# Patient Record
Sex: Male | Born: 1990 | Race: Black or African American | Hispanic: No | Marital: Single | Smoking: Never smoker
Health system: Southern US, Community
[De-identification: ages and names within clinical notes are randomized; demographics above are authoritative.]

## PROBLEM LIST (undated history)

## (undated) HISTORY — PX: WISDOM TOOTH EXTRACTION: SHX21

---
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
## 2010-02-19 ENCOUNTER — Emergency Department (HOSPITAL_COMMUNITY): Admission: EM | Admit: 2010-02-19 | Discharge: 2010-02-19 | Payer: Self-pay | Admitting: Family Medicine

## 2010-03-15 ENCOUNTER — Emergency Department (HOSPITAL_COMMUNITY): Admission: EM | Admit: 2010-03-15 | Discharge: 2010-03-15 | Payer: Self-pay | Admitting: Emergency Medicine

## 2010-06-24 ENCOUNTER — Ambulatory Visit (HOSPITAL_COMMUNITY): Admission: RE | Admit: 2010-06-24 | Discharge: 2010-06-24 | Payer: Self-pay | Admitting: General Surgery

## 2010-07-21 ENCOUNTER — Emergency Department (HOSPITAL_COMMUNITY): Admission: EM | Admit: 2010-07-21 | Discharge: 2010-07-22 | Payer: Self-pay | Admitting: Emergency Medicine

## 2010-11-22 ENCOUNTER — Inpatient Hospital Stay (INDEPENDENT_AMBULATORY_CARE_PROVIDER_SITE_OTHER)
Admission: RE | Admit: 2010-11-22 | Discharge: 2010-11-22 | Disposition: A | Discharge status: Home / Self Care | Payer: Self-pay | Source: Ambulatory Visit | Attending: Family Medicine | Admitting: Family Medicine

## 2010-11-22 DIAGNOSIS — J019 Acute sinusitis, unspecified: Secondary | ICD-10-CM

## 2010-12-02 LAB — SURGICAL PCR SCREEN: Staphylococcus aureus: NEGATIVE

## 2010-12-02 LAB — CBC
HCT: 40.2 % (ref 39.0–52.0)
Hemoglobin: 13.3 g/dL (ref 13.0–17.0)
MCH: 28.7 pg (ref 26.0–34.0)
RBC: 4.64 MIL/uL (ref 4.22–5.81)

## 2010-12-02 LAB — POCT I-STAT 4, (NA,K, GLUC, HGB,HCT)
Glucose, Bld: 85 mg/dL (ref 70–99)
Potassium: 3.9 mEq/L (ref 3.5–5.1)

## 2010-12-06 LAB — POCT I-STAT, CHEM 8
HCT: 47 % (ref 39.0–52.0)
Hemoglobin: 16 g/dL (ref 13.0–17.0)
Potassium: 3.8 mEq/L (ref 3.5–5.1)
Sodium: 141 mEq/L (ref 135–145)

## 2010-12-06 LAB — DIFFERENTIAL
Basophils Relative: 1 % (ref 0–1)
Eosinophils Absolute: 0 10*3/uL (ref 0.0–0.7)
Lymphs Abs: 1.6 10*3/uL (ref 0.7–4.0)
Neutrophils Relative %: 69 % (ref 43–77)

## 2010-12-06 LAB — CBC
MCHC: 33.5 g/dL (ref 30.0–36.0)
MCV: 89.9 fL (ref 78.0–100.0)
Platelets: 279 10*3/uL (ref 150–400)
WBC: 7.1 10*3/uL (ref 4.0–10.5)

## 2011-01-13 ENCOUNTER — Emergency Department (HOSPITAL_COMMUNITY)
Admission: EM | Admit: 2011-01-13 | Discharge: 2011-01-14 | Disposition: A | Discharge status: Home / Self Care | Payer: Self-pay | Attending: Emergency Medicine | Admitting: Emergency Medicine

## 2011-01-13 DIAGNOSIS — R55 Syncope and collapse: Secondary | ICD-10-CM | POA: Insufficient documentation

## 2011-01-14 LAB — BASIC METABOLIC PANEL
GFR calc Af Amer: >60 mL/min (ref >60)
GFR calc non Af Amer: >60 mL/min (ref >60)
Potassium: 4.2 mEq/L (ref 3.5–5.1)
Sodium: 140 mEq/L (ref 135–145)

## 2011-01-14 LAB — URINALYSIS, ROUTINE W REFLEX MICROSCOPIC
Bilirubin Urine: NEGATIVE
Hgb urine dipstick: NEGATIVE
Nitrite: NEGATIVE
Specific Gravity, Urine: 1.021 (ref 1.005–1.030)
pH: 6 (ref 5.0–8.0)

## 2011-01-14 LAB — DIFFERENTIAL
Lymphocytes Relative: 34 % (ref 12–46)
Lymphs Abs: 2.6 10*3/uL (ref 0.7–4.0)
Neutrophils Relative %: 57 % (ref 43–77)

## 2011-01-14 LAB — CBC
HCT: 40.9 % (ref 39.0–52.0)
MCV: 87.2 fL (ref 78.0–100.0)
Platelets: 295 10*3/uL (ref 150–400)
RBC: 4.69 MIL/uL (ref 4.22–5.81)
WBC: 7.8 10*3/uL (ref 4.0–10.5)

## 2011-06-20 LAB — GC/CHLAMYDIA PROBE AMP, GENITAL
Chlamydia, DNA Probe: NEGATIVE
GC Probe Amp, Genital: NEGATIVE

## 2012-01-24 ENCOUNTER — Encounter (HOSPITAL_COMMUNITY): Payer: Self-pay | Admitting: *Deleted

## 2012-01-24 ENCOUNTER — Emergency Department (HOSPITAL_COMMUNITY)
Admission: EM | Admit: 2012-01-24 | Discharge: 2012-01-25 | Disposition: A | Discharge status: Home / Self Care | Payer: Self-pay | Attending: Emergency Medicine | Admitting: Emergency Medicine

## 2012-01-24 DIAGNOSIS — R509 Fever, unspecified: Secondary | ICD-10-CM | POA: Insufficient documentation

## 2012-01-24 DIAGNOSIS — IMO0001 Reserved for inherently not codable concepts without codable children: Secondary | ICD-10-CM | POA: Insufficient documentation

## 2012-01-24 DIAGNOSIS — R599 Enlarged lymph nodes, unspecified: Secondary | ICD-10-CM | POA: Insufficient documentation

## 2012-01-24 DIAGNOSIS — R51 Headache: Secondary | ICD-10-CM | POA: Insufficient documentation

## 2012-01-24 DIAGNOSIS — J020 Streptococcal pharyngitis: Secondary | ICD-10-CM | POA: Insufficient documentation

## 2012-01-24 LAB — COMPREHENSIVE METABOLIC PANEL
AST: 23 U/L (ref 0–37)
Albumin: 4.5 g/dL (ref 3.5–5.2)
Alkaline Phosphatase: 74 U/L (ref 39–117)
Chloride: 102 mEq/L (ref 96–112)
Potassium: 3.7 mEq/L (ref 3.5–5.1)
Sodium: 139 mEq/L (ref 135–145)
Total Bilirubin: 0.5 mg/dL (ref 0.3–1.2)
Total Protein: 7.5 g/dL (ref 6.0–8.3)

## 2012-01-24 LAB — CBC
Hemoglobin: 14.4 g/dL (ref 13.0–17.0)
MCH: 29.8 pg (ref 26.0–34.0)
MCHC: 33.8 g/dL (ref 30.0–36.0)
Platelets: 281 10*3/uL (ref 150–400)
RDW: 12.1 % (ref 11.5–15.5)

## 2012-01-24 LAB — URINALYSIS, ROUTINE W REFLEX MICROSCOPIC
Ketones, ur: NEGATIVE mg/dL
Leukocytes, UA: NEGATIVE
Nitrite: NEGATIVE
Protein, ur: NEGATIVE mg/dL
Urobilinogen, UA: 1 mg/dL (ref 0.0–1.0)
pH: 7.5 (ref 5.0–8.0)

## 2012-01-24 LAB — DIFFERENTIAL
Basophils Absolute: 0 10*3/uL (ref 0.0–0.1)
Basophils Relative: 0 % (ref 0–1)
Eosinophils Absolute: 0 10*3/uL (ref 0.0–0.7)
Neutro Abs: 8.3 10*3/uL — ABNORMAL HIGH (ref 1.7–7.7)
Neutrophils Relative %: 73 % (ref 43–77)

## 2012-01-24 NOTE — ED Notes (Signed)
Aching all over chills nausea no diarrhea

## 2012-01-25 LAB — RAPID STREP SCREEN (MED CTR MEBANE ONLY): Streptococcus, Group A Screen (Direct): POSITIVE — AB

## 2012-01-25 MED ORDER — ONDANSETRON HCL 4 MG/2ML IJ SOLN
4.0000 mg | Freq: Once | INTRAMUSCULAR | Status: AC
  Administered 2012-01-25: 4 mg via INTRAVENOUS
  Filled 2012-01-25: qty 2

## 2012-01-25 MED ORDER — KETOROLAC TROMETHAMINE 30 MG/ML IJ SOLN
30.0000 mg | Freq: Once | INTRAMUSCULAR | Status: AC
  Administered 2012-01-25: 30 mg via INTRAVENOUS
  Filled 2012-01-25: qty 1

## 2012-01-25 MED ORDER — SODIUM CHLORIDE 0.9 % IV BOLUS (SEPSIS)
1000.0000 mL | Freq: Once | INTRAVENOUS | Status: AC
  Administered 2012-01-25: 1000 mL via INTRAVENOUS

## 2012-01-25 MED ORDER — PENICILLIN G BENZATHINE 1200000 UNIT/2ML IM SUSP
1.2000 10*6.[IU] | INTRAMUSCULAR | Status: AC
  Administered 2012-01-25: 1.2 10*6.[IU] via INTRAMUSCULAR
  Filled 2012-01-25: qty 2

## 2012-01-25 NOTE — Discharge Instructions (Signed)
Salt Water Gargle  This solution will help make your mouth and throat feel better.  HOME CARE INSTRUCTIONS     Mix 1 teaspoon of salt in 8 ounces of warm water.   Gargle with this solution as much or often as you need or as directed. Swish and gargle gently if you have any sores or wounds in your mouth.   Do not swallow this mixture.  Document Released: 06/09/2004 Document Revised: 08/25/2011 Document Reviewed: 10/31/2008  Exit[employer] Patient Information 2012 [employer]    Strep Throat  Strep throat is an infection of the throat caused by a bacteria named Streptococcus pyogenes. Your caregiver may call the infection streptococcal "tonsillitis" or "pharyngitis" depending on whether there are signs of inflammation in the tonsils or back of the throat. Strep throat is most common in children from 5 to 15 years old during the cold months of the year, but it can occur in people of any age during any season. This infection is spread from person to person (contagious) through coughing, sneezing, or other close contact.  SYMPTOMS     Fever or chills.   Painful, swollen, red tonsils or throat.   Pain or difficulty when swallowing.   White or yellow spots on the tonsils or throat.   Swollen, tender lymph nodes or "glands" of the neck or under the jaw.   Red rash all over the body (rare).  DIAGNOSIS    Many different infections can cause the same symptoms. A test must be done to confirm the diagnosis so the right treatment can be given. A "rapid strep test" can help your caregiver make the diagnosis in a few minutes. If this test is not available, a light swab of the infected area can be used for a throat culture test. If a throat culture test is done, results are usually available in a day or two.  TREATMENT    Strep throat is treated with antibiotic medicine.  HOME CARE INSTRUCTIONS     Gargle with 1 tsp of salt in 1 cup of warm water, 3 to 4 times per day or as needed for comfort.    Family members who also have a sore throat or fever should be tested for strep throat and treated with antibiotics if they have the strep infection.   Make sure everyone in your household washes their hands well.   Do not share food, drinking cups, or personal items that could cause the infection to spread to others.   You may need to eat a soft food diet until your sore throat gets better.   Drink enough water and fluids to keep your urine clear or pale yellow. This will help prevent dehydration.   Get plenty of rest.   Stay home from school, daycare, or work until you have been on antibiotics for 24 hours.   Only take over-the-counter or prescription medicines for pain, discomfort, or fever as directed by your caregiver.   If antibiotics are prescribed, take them as directed. Finish them even if you start to feel better.  SEEK MEDICAL CARE IF:     The glands in your neck continue to enlarge.   You develop a rash, cough, or earache.   You cough up green, yellow-brown, or bloody sputum.   You have pain or discomfort not controlled by medicines.   Your problems seem to be getting worse rather than better.  SEEK IMMEDIATE MEDICAL CARE IF:     You develop any new symptoms such   as vomiting, severe headache, stiff or painful neck, chest pain, shortness of breath, or trouble swallowing.   You develop severe throat pain, drooling, or changes in your voice.   You develop swelling of the neck, or the skin on the neck becomes red and tender.   You have a fever.   You develop signs of dehydration, such as fatigue, dry mouth, and decreased urination.   You become increasingly sleepy, or you cannot wake up completely.  Document Released: 09/02/2000 Document Revised: 08/25/2011 Document Reviewed: 11/04/2010  [employer] Patient Information 2012 [employer].

## 2012-01-25 NOTE — ED Provider Notes (Signed)
History     CSN: [id number]  Arrival date & time 01/24/12  2151   First MD Initiated Contact with Patient 01/25/12 0105      Chief Complaint  Patient presents with  . aching all over     (Consider location/radiation/quality/duration/timing/severity/associated sxs/prior treatment) HPI Comments: Patient here with sore throat since today - states that he felt fine when he awoke - states now with body aches, headache, fever, chills, - states he is able to swallow but pain with swallowing - reports nausea but denies vomiting, diarrhea, abdominal pain.  Patient is a 21 y.o. male presenting with pharyngitis. The history is provided by the patient. No language interpreter was used.  Sore Throat This is a new problem. The current episode started today. The problem occurs constantly. The problem has been unchanged. Associated symptoms include anorexia, chills, congestion, a fever, headaches, myalgias, a sore throat and swollen glands. Pertinent negatives include no abdominal pain, arthralgias, change in bowel habit, chest pain, coughing, diaphoresis, fatigue, joint swelling, nausea, neck pain, numbness, rash, urinary symptoms, vertigo, visual change, vomiting or weakness. The symptoms are aggravated by nothing. He has tried nothing for the symptoms. The treatment provided no relief.    History reviewed. No pertinent past medical history.  History reviewed. No pertinent past surgical history.  No family history on file.  History  Substance Use Topics  . Smoking status: Never Smoker   . Smokeless tobacco: Not on file  . Alcohol Use: Yes      Review of Systems  Constitutional: Positive for fever and chills. Negative for diaphoresis and fatigue.  HENT: Positive for congestion and sore throat. Negative for neck pain.   Respiratory: Negative for cough.   Cardiovascular: Negative for chest pain.  Gastrointestinal: Positive for anorexia. Negative for nausea, vomiting, abdominal pain and change  in bowel habit.  Musculoskeletal: Positive for myalgias. Negative for joint swelling and arthralgias.  Skin: Negative for rash.  Neurological: Positive for headaches. Negative for vertigo, weakness and numbness.  All other systems reviewed and are negative.    Allergies  Review of patient's allergies indicates no known allergies.  Home Medications  No current outpatient prescriptions on file.  BP 103/55  Pulse 103  Temp(Src) 99.9 F (37.7 C) (Oral)  Resp 16  SpO2 100%  Physical Exam  Nursing note and vitals reviewed. Constitutional: He is oriented to person, place, and time. He appears well-developed and well-nourished. No distress.  HENT:  Head: Normocephalic and atraumatic.  Right Ear: External ear normal.  Left Ear: External ear normal.  Nose: Nose normal.  Mouth/Throat: Oropharyngeal exudate present.       Posterior pharyngeal erythema  Eyes: Conjunctivae are normal. Pupils are equal, round, and reactive to light. No scleral icterus.  Neck: Normal range of motion. Neck supple.       Anterior cervical lymphadenopathy  Cardiovascular: Regular rhythm and normal heart sounds.  Exam reveals no gallop and no friction rub.   No murmur heard.      tachycardia  Pulmonary/Chest: Effort normal and breath sounds normal. No respiratory distress. He has no wheezes. He has no rales. He exhibits no tenderness.  Abdominal: Soft. Bowel sounds are normal. He exhibits no distension. There is no tenderness.  Musculoskeletal: Normal range of motion. He exhibits no edema and no tenderness.  Lymphadenopathy:    He has cervical adenopathy.  Neurological: He is alert and oriented to person, place, and time. No cranial nerve deficit.  Skin: Skin is warm and dry. No  rash noted. No erythema. No pallor.  Psychiatric: He has a normal mood and affect. His behavior is normal. Judgment and thought content normal.    ED Course  Procedures (including critical care time)  Labs Reviewed  CBC -  Abnormal; Notable for the following:    WBC 11.3 (*)    All other components within normal limits  DIFFERENTIAL - Abnormal; Notable for the following:    Neutro Abs 8.3 (*)    All other components within normal limits  RAPID STREP SCREEN - Abnormal; Notable for the following:    Streptococcus, Group A Screen (Direct) POSITIVE (*)    All other components within normal limits  URINALYSIS, ROUTINE W REFLEX MICROSCOPIC  COMPREHENSIVE METABOLIC PANEL   No results found.  Results for orders placed during the hospital encounter of 01/24/12  URINALYSIS, ROUTINE W REFLEX MICROSCOPIC      Component Value Range   Color, Urine YELLOW  YELLOW    APPearance CLEAR  CLEAR    Specific Gravity, Urine 1.020  1.005 - 1.030    pH 7.5  5.0 - 8.0    Glucose, UA NEGATIVE  NEGATIVE (mg/dL)   Hgb urine dipstick NEGATIVE  NEGATIVE    Bilirubin Urine NEGATIVE  NEGATIVE    Ketones, ur NEGATIVE  NEGATIVE (mg/dL)   Protein, ur NEGATIVE  NEGATIVE (mg/dL)   Urobilinogen, UA 1.0  0.0 - 1.0 (mg/dL)   Nitrite NEGATIVE  NEGATIVE    Leukocytes, UA NEGATIVE  NEGATIVE   CBC      Component Value Range   WBC 11.3 (*) 4.0 - 10.5 (K/uL)   RBC 4.84  4.22 - 5.81 (MIL/uL)   Hemoglobin 14.4  13.0 - 17.0 (g/dL)   HCT 65.7  84.6 - 96.2 (%)   MCV 88.0  78.0 - 100.0 (fL)   MCH 29.8  26.0 - 34.0 (pg)   MCHC 33.8  30.0 - 36.0 (g/dL)   RDW 95.2  84.1 - 32.4 (%)   Platelets 281  150 - 400 (K/uL)  DIFFERENTIAL      Component Value Range   Neutrophils Relative 73  43 - 77 (%)   Neutro Abs 8.3 (*) 1.7 - 7.7 (K/uL)   Lymphocytes Relative 18  12 - 46 (%)   Lymphs Abs 2.0  0.7 - 4.0 (K/uL)   Monocytes Relative 8  3 - 12 (%)   Monocytes Absolute 0.9  0.1 - 1.0 (K/uL)   Eosinophils Relative 0  0 - 5 (%)   Eosinophils Absolute 0.0  0.0 - 0.7 (K/uL)   Basophils Relative 0  0 - 1 (%)   Basophils Absolute 0.0  0.0 - 0.1 (K/uL)  COMPREHENSIVE METABOLIC PANEL      Component Value Range   Sodium 139  135 - 145 (mEq/L)   Potassium  3.7  3.5 - 5.1 (mEq/L)   Chloride 102  96 - 112 (mEq/L)   CO2 29  19 - 32 (mEq/L)   Glucose, Bld 95  70 - 99 (mg/dL)   BUN 10  6 - 23 (mg/dL)   Creatinine, Ser 4.01  0.50 - 1.35 (mg/dL)   Calcium 02.7  8.4 - 10.5 (mg/dL)   Total Protein 7.5  6.0 - 8.3 (g/dL)   Albumin 4.5  3.5 - 5.2 (g/dL)   AST 23  0 - 37 (U/L)   ALT 38  0 - 53 (U/L)   Alkaline Phosphatase 74  39 - 117 (U/L)   Total Bilirubin 0.5  0.3 - 1.2 (  mg/dL)   GFR calc non Af Amer >90  >90 (mL/min)   GFR calc Af Amer >90  >90 (mL/min)  RAPID STREP SCREEN      Component Value Range   Streptococcus, Group A Screen (Direct) POSITIVE (*) NEGATIVE    No results found.   Strep pharyngitis   MDM  Patient with strep pharyngitis - feels better after a liter of fluids and toradol for pain and body aches - given injection of bicillin LA for the infection.        Izola Price [place], [place] 01/25/12 0301

## 2012-01-25 NOTE — ED Provider Notes (Signed)
Medical screening examination/treatment/procedure(s) were performed by non-physician practitioner and as supervising physician I was immediately available for consultation/collaboration.   Wandalee Klang, MD 01/25/12 0702 

## 2014-02-21 ENCOUNTER — Emergency Department (INDEPENDENT_AMBULATORY_CARE_PROVIDER_SITE_OTHER)
Admission: EM | Admit: 2014-02-21 | Discharge: 2014-02-21 | Disposition: A | Discharge status: Home / Self Care | Payer: Self-pay | Source: Home / Self Care | Attending: Emergency Medicine | Admitting: Emergency Medicine

## 2014-02-21 ENCOUNTER — Encounter (HOSPITAL_COMMUNITY): Payer: Self-pay | Admitting: Emergency Medicine

## 2014-02-21 DIAGNOSIS — J019 Acute sinusitis, unspecified: Secondary | ICD-10-CM

## 2014-02-21 MED ORDER — IBUPROFEN 800 MG PO TABS
800.0000 mg | ORAL_TABLET | Freq: Once | ORAL | Status: AC
  Administered 2014-02-21: 800 mg via ORAL

## 2014-02-21 MED ORDER — IBUPROFEN 800 MG PO TABS
ORAL_TABLET | ORAL | Status: AC
  Filled 2014-02-21: qty 1

## 2014-02-21 MED ORDER — HYDROCODONE-ACETAMINOPHEN 5-325 MG PO TABS
ORAL_TABLET | ORAL | Status: DC
Start: 2014-02-21 — End: 2014-04-22

## 2014-02-21 MED ORDER — AMOXICILLIN 500 MG PO CAPS: 1000.0000 mg | ORAL_CAPSULE | Freq: Three times a day (TID) | ORAL | Status: DC

## 2014-02-21 MED ORDER — GUAIFENESIN-CODEINE 100-10 MG/5ML PO SYRP: 10.0000 mL | ORAL_SOLUTION | Freq: Four times a day (QID) | ORAL | Status: DC | PRN

## 2014-02-21 NOTE — ED Provider Notes (Signed)
  Chief Complaint   Chief Complaint  Patient presents with  . URI    History of Present Illness   Jeffrey Peck is a 23 year old male who has had a two-day history of chills, myalgias, headache, nasal congestion with yellow-green drainage, sinus pressure, red eyes, dry cough, back pain, and sore throat. He denies any wheezing, difficulty breathing, or GI symptoms. No sick exposures.  Review of Systems   Other than as noted above, the patient denies any of the following symptoms: Systemic:  No fevers, chills, sweats, or myalgias. Eye:  No redness or discharge. ENT:  No ear pain, headache, nasal congestion, drainage, sinus pressure, or sore throat. Neck:  No neck pain, stiffness, or swollen glands. Lungs:  No cough, sputum production, hemoptysis, wheezing, chest tightness, shortness of breath or chest pain. GI:  No abdominal pain, nausea, vomiting or diarrhea.  PMFSH   Past medical history, family history, social history, meds, and allergies were reviewed.   Physical exam   Vital signs:  BP 129/83  Pulse 73  Temp(Src) 98.2 F (36.8 C) (Oral)  Resp 18  SpO2 100% General:  Alert and oriented.  In no distress.  Skin warm and dry. Eye:  No conjunctival injection or drainage. Lids were normal. ENT:  TMs and canals were normal, without erythema or inflammation.  Nasal mucosa was congested with yellow drainage in the left nostril.  Mucous membranes were moist.  Pharynx was clear with no exudate or drainage.  There were no oral ulcerations or lesions. Neck:  Supple, no adenopathy, tenderness or mass. Lungs:  No respiratory distress.  Lungs were clear to auscultation, without wheezes, rales or rhonchi.  Breath sounds were clear and equal bilaterally.  Heart:  Regular rhythm, without gallops, murmers or rubs. Skin:  Clear, warm, and dry, without rash or lesions.   Assessment     The encounter diagnosis was Acute sinusitis.  Plan    1.  Meds:  The following meds were prescribed:    Discharge Medication List as of 02/21/2014  9:04 PM    START taking these medications   Details  amoxicillin (AMOXIL) 500 MG capsule Take 2 capsules (1,000 mg total) by mouth 3 (three) times daily., Starting 02/21/2014, Until Discontinued, Normal    guaiFENesin-codeine (GUIATUSS AC) 100-10 MG/5ML syrup Take 10 mLs by mouth 4 (four) times daily as needed for cough., Starting 02/21/2014, Until Discontinued, Print    HYDROcodone-acetaminophen (NORCO/VICODIN) 5-325 MG per tablet 1 to 2 tabs every 4 to 6 hours as needed for pain., Print        2.  Patient Education/Counseling:  The patient was given appropriate handouts, self care instructions, and instructed in symptomatic relief.  Instructed to get extra fluids, rest, and use a cool mist vaporizer.    3.  Follow up:  The patient was told to follow up here if no better in 3 to 4 days, or sooner if becoming worse in any way, and given some red flag symptoms such as increasing fever, difficulty breathing, chest pain, or persistent vomiting which would prompt immediate return.  Follow up here as needed.      Reuben Likes, MD 02/21/14 [phone]

## 2014-02-21 NOTE — ED Notes (Signed)
Pt c/o cold sx onset yest  Sx include: chlls, BA, HA, congestion Denies f/v/n/d Alert w/no signs of acute distress.

## 2014-02-21 NOTE — Discharge Instructions (Signed)
Most upper respiratory infections are caused by viruses and do not require antibiotics.  We try to save the antibiotics for when we really need them to prevent bacteria from developing resistance to them.  Here are a few hints about things that can be done at home to help get over an upper respiratory infection quicker: ° °Get extra sleep and extra fluids.  Get 7 to 9 hours of sleep per night and 6 to 8 glasses of water a day.  Getting extra sleep keeps the immune system from getting run down.  Most people with an upper respiratory infection are a little dehydrated.  The extra fluids also keep the secretions liquified and easier to deal with.  Also, get extra vitamin C.  4000 mg per day is the recommended dose. °For the aches, headache, and fever, acetaminophen or ibuprofen are helpful.  These can be alternated every 4 hours.  People with liver disease should avoid large amounts of acetaminophen, and people with ulcer disease, gastroesophageal reflux, gastritis, congestive heart failure, chronic kidney disease, coronary artery disease and the elderly should avoid ibuprofen. °For nasal congestion try Mucinex-D, or if you're having lots of sneezing or clear nasal drainage use Zyrtec-D. People with high blood pressure can take these if their blood pressure is controlled, if not, it's best to avoid the forms with a "D" (decongestants).  You can use the plain Mucinex, Allegra, Claritin, or Zyrtec even if your blood pressure is not controlled.   °A Saline nasal spray such as Ocean Spray can also help.  You can add a decongestant sprays such as Afrin, but you should not use the decongestant sprays for more than 3 or 4 days since they can be habituating.  Breathe Rite nasal strips can also offer a non-drug alternative treatment to nasal congestion, especially at night. °For people with symptoms of sinusitis, sleeping with your head elevated can be helpful.  For sinus pain, moist, hot compresses to the face may provide some  relief.  Many people find that inhaling steam as in a shower or from a pot of steaming water can help. °For any viral infection, zinc containing lozenges such as Cold-Eze or Zicam are helpful.  Zinc helps to fight viral infection.  Hot salt water gargles (8 oz of hot water, 1/2 tsp of table salt, and a pinch of baking soda) can give relief as well as hot beverages such as hot tea.  Sucrets extra strength lozenges will help the sore throat.  °For the cough, take Delsym 2 tsp every 12 hours.  It has also been found recently that Aleve can help control a cough.  The dose is 1 to 2 tablets twice daily with food.  This can be combined with Delsym. (Note, if you are taking ibuprofen, you should not take Aleve as well--take one or the other.) °A cool mist vaporizer will help keep your mucous membranes from drying out.  ° °It's important when you have an upper respiratory infection not to pass the infection to others.  This involves being very careful about the following: ° °Frequent hand washing or use of hand sanitizer, especially after coughing, sneezing, blowing your nose or touching your face, nose or eyes. °Do not shake hands or touch anyone and try to avoid touching surfaces that other people use such as doorknobs, shopping carts, telephones and computer keyboards. °Use tissues and dispose of them properly in a garbage can or ziplock bag. °Cough into your sleeve. °Do not let others eat or   drink after you. ° °It's also important to recognize the signs of serious illness and get evaluated if they occur: °Any respiratory infection that lasts more than 7 to 10 days.  Yellow nasal drainage and sputum are not reliable indicators of a bacterial infection, but if they last for more than 1 week, see your doctor. °Fever and sore throat can indicate strep. °Fever and cough can indicate influenza or pneumonia. °Any kind of severe symptom such as difficulty breathing, intractable vomiting, or severe pain should prompt you to see  a doctor as soon as possible. ° ° °Your body's immune system is really the thing that will get rid of this infection.  Your immune system is comprised of 2 types of specialized cells called T cells and B cells.  T cells coordinate the array of cells in your body that engulf invading bacteria or viruses while B cells orchestrate the production of antibodies that neutralize infection.  Anything we do or any medications we give you, will just strengthen your immune system or help it clear up the infection quicker.  Here are a few helpful hints to improve your immune system to help overcome this illness or to prevent future infections: °· A few vitamins can improve the health of your immune system.  That's why your diet should include plenty of fruits, vegetables, fish, nuts, and whole grains. °· Vitamin A and bet-carotene can increase the cells that fight infections (T cells and B cells).  Vitamin A is abundant in dark greens and orange vegetables such as spinach, greens, sweet potatoes, and carrots. °· Vitamin B6 contributes to the maturation of white blood cells, the cells that fight disease.  Foods with vitamin B6 include cold cereal and bananas. °· Vitamin C is credited with preventing colds because it increases white blood cells and also prevents cellular damage.  Citrus fruits, peaches and green and red bell peppers are all hight in vitamin C. °· Vitamin E is an anti-oxidant that encourages the production of natural killer cells which reject foreign invaders and B cells that produce antibodies.  Foods high in vitamin E include wheat germ, nuts and seeds. °· Foods high in omega-3 fatty acids found in foods like salmon, tuna and mackerel boost your immune system and help cells to engulf and absorb germs. °· Probiotics are good bacteria that increase your T cells.  These can be found in yogurt and are available in supplements such as Culturelle or Align. °· Moderate exercise increases the strength of your immune  system and your ability to recover from illness.  I suggest 3 to 5 moderate intensity 30 minute workouts per week.   °· Sleep is another component of maintaining a strong immune system.  It enables your body to recuperate from the day's activities, stress and work.  My recommendation is to get between 7 and 9 hours of sleep per night. °· If you smoke, try to quit completely or at least cut down.  Drink alcohol only in moderation if at all.  No more than 2 drinks daily for men or 1 for women. °· Get a flu vaccine early in the fall or if you have not gotten one yet, once this illness has run its course.  If you are over 65, a smoker, or an asthmatic, get a pneumococcal vaccine. °· My final recommendation is to maintain a healthy weight.  Excess weight can impair the immune system by interfering with the way the immune system deals with invading viruses or   bacteria.    Sinusitis Sinusitis is redness, soreness, and swelling (inflammation) of the paranasal sinuses. Paranasal sinuses are air pockets within the bones of your face (beneath the eyes, the middle of the forehead, or above the eyes). In healthy paranasal sinuses, mucus is able to drain out, and air is able to circulate through them by way of your nose. However, when your paranasal sinuses are inflamed, mucus and air can become trapped. This can allow bacteria and other germs to grow and cause infection. Sinusitis can develop quickly and last only a short time (acute) or continue over a long period (chronic). Sinusitis that lasts for more than 12 weeks is considered chronic.  CAUSES  Causes of sinusitis include:  Allergies.  Structural abnormalities, such as displacement of the cartilage that separates your nostrils (deviated septum), which can decrease the air flow through your nose and sinuses and affect sinus drainage.  Functional abnormalities, such as when the small hairs (cilia) that line your sinuses and help remove mucus do not work  properly or are not present. SYMPTOMS  Symptoms of acute and chronic sinusitis are the same. The primary symptoms are pain and pressure around the affected sinuses. Other symptoms include:  Upper toothache.  Earache.  Headache.  Bad breath.  Decreased sense of smell and taste.  A cough, which worsens when you are lying flat.  Fatigue.  Fever.  Thick drainage from your nose, which often is green and may contain pus (purulent).  Swelling and warmth over the affected sinuses. DIAGNOSIS  Your caregiver will perform a physical exam. During the exam, your caregiver may:  Look in your nose for signs of abnormal growths in your nostrils (nasal polyps).  Tap over the affected sinus to check for signs of infection.  View the inside of your sinuses (endoscopy) with a special imaging device with a light attached (endoscope), which is inserted into your sinuses. If your caregiver suspects that you have chronic sinusitis, one or more of the following tests may be recommended:  Allergy tests.  Nasal culture A sample of mucus is taken from your nose and sent to a lab and screened for bacteria.  Nasal cytology A sample of mucus is taken from your nose and examined by your caregiver to determine if your sinusitis is related to an allergy. TREATMENT  Most cases of acute sinusitis are related to a viral infection and will resolve on their own within 10 days. Sometimes medicines are prescribed to help relieve symptoms (pain medicine, decongestants, nasal steroid sprays, or saline sprays).  However, for sinusitis related to a bacterial infection, your caregiver will prescribe antibiotic medicines. These are medicines that will help kill the bacteria causing the infection.  Rarely, sinusitis is caused by a fungal infection. In theses cases, your caregiver will prescribe antifungal medicine. For some cases of chronic sinusitis, surgery is needed. Generally, these are cases in which sinusitis recurs  more than 3 times per year, despite other treatments. HOME CARE INSTRUCTIONS   Drink plenty of water. Water helps thin the mucus so your sinuses can drain more easily.  Use a humidifier.  Inhale steam 3 to 4 times a day (for example, sit in the bathroom with the shower running).  Apply a warm, moist washcloth to your face 3 to 4 times a day, or as directed by your caregiver.  Use saline nasal sprays to help moisten and clean your sinuses.  Take over-the-counter or prescription medicines for pain, discomfort, or fever only as directed by  your caregiver. SEEK IMMEDIATE MEDICAL CARE IF:  You have increasing pain or severe headaches.  You have nausea, vomiting, or drowsiness.  You have swelling around your face.  You have vision problems.  You have a stiff neck.  You have difficulty breathing. MAKE SURE YOU:   Understand these instructions.  Will watch your condition.  Will get help right away if you are not doing well or get worse. Document Released: 09/05/2005 Document Revised: 11/28/2011 Document Reviewed: 09/20/2011 [facility] Patient Information 2014 [place], [place].

## 2014-04-22 ENCOUNTER — Emergency Department (INDEPENDENT_AMBULATORY_CARE_PROVIDER_SITE_OTHER): Payer: Self-pay

## 2014-04-22 ENCOUNTER — Encounter (HOSPITAL_COMMUNITY): Payer: Self-pay | Admitting: Emergency Medicine

## 2014-04-22 ENCOUNTER — Emergency Department (INDEPENDENT_AMBULATORY_CARE_PROVIDER_SITE_OTHER)
Admission: EM | Admit: 2014-04-22 | Discharge: 2014-04-22 | Disposition: A | Discharge status: Home / Self Care | Payer: Self-pay | Source: Home / Self Care | Attending: Emergency Medicine | Admitting: Emergency Medicine

## 2014-04-22 DIAGNOSIS — S39012A Strain of muscle, fascia and tendon of lower back, initial encounter: Secondary | ICD-10-CM

## 2014-04-22 DIAGNOSIS — S335XXA Sprain of ligaments of lumbar spine, initial encounter: Secondary | ICD-10-CM

## 2014-04-22 DIAGNOSIS — G44209 Tension-type headache, unspecified, not intractable: Secondary | ICD-10-CM

## 2014-04-22 MED ORDER — HYDROCODONE-ACETAMINOPHEN 5-325 MG PO TABS: ORAL_TABLET | ORAL | Status: DC
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
## 2014-11-20 ENCOUNTER — Emergency Department (HOSPITAL_BASED_OUTPATIENT_CLINIC_OR_DEPARTMENT_OTHER): Payer: Self-pay

## 2014-11-20 ENCOUNTER — Emergency Department (HOSPITAL_BASED_OUTPATIENT_CLINIC_OR_DEPARTMENT_OTHER)
Admission: EM | Admit: 2014-11-20 | Discharge: 2014-11-20 | Disposition: A | Discharge status: Home / Self Care | Payer: Self-pay | Attending: Emergency Medicine | Admitting: Emergency Medicine

## 2014-11-20 ENCOUNTER — Encounter (HOSPITAL_BASED_OUTPATIENT_CLINIC_OR_DEPARTMENT_OTHER): Payer: Self-pay

## 2014-11-20 DIAGNOSIS — R52 Pain, unspecified: Secondary | ICD-10-CM

## 2014-11-20 DIAGNOSIS — Z792 Long term (current) use of antibiotics: Secondary | ICD-10-CM | POA: Insufficient documentation

## 2014-11-20 DIAGNOSIS — L03113 Cellulitis of right upper limb: Secondary | ICD-10-CM | POA: Insufficient documentation

## 2014-11-20 LAB — CBC WITH DIFFERENTIAL/PLATELET
BASOS PCT: 0 % (ref 0–1)
Basophils Absolute: 0 10*3/uL (ref 0.0–0.1)
Eosinophils Absolute: 0.1 10*3/uL (ref 0.0–0.7)
Eosinophils Relative: 1 % (ref 0–5)
HCT: 44.7 % (ref 39.0–52.0)
HEMOGLOBIN: 14.7 g/dL (ref 13.0–17.0)
Lymphocytes Relative: 25 % (ref 12–46)
Lymphs Abs: 2.3 10*3/uL (ref 0.7–4.0)
MCH: 29.2 pg (ref 26.0–34.0)
MCHC: 32.9 g/dL (ref 30.0–36.0)
MCV: 88.7 fL (ref 78.0–100.0)
MONO ABS: 0.7 10*3/uL (ref 0.1–1.0)
MONOS PCT: 8 % (ref 3–12)
NEUTROS ABS: 6 10*3/uL (ref 1.7–7.7)
NEUTROS PCT: 66 % (ref 43–77)
PLATELETS: 303 10*3/uL (ref 150–400)
RBC: 5.04 MIL/uL (ref 4.22–5.81)
RDW: 12.3 % (ref 11.5–15.5)
WBC: 9 10*3/uL (ref 4.0–10.5)

## 2014-11-20 LAB — D-DIMER, QUANTITATIVE: D-Dimer, Quant: 0.27 ug/mL-FEU (ref 0.00–0.48)

## 2014-11-20 LAB — BASIC METABOLIC PANEL
ANION GAP: 3 — AB (ref 5–15)
BUN: 9 mg/dL (ref 6–23)
CHLORIDE: 104 mmol/L (ref 96–112)
CO2: 28 mmol/L (ref 19–32)
Calcium: 8.9 mg/dL (ref 8.4–10.5)
Creatinine, Ser: 0.9 mg/dL (ref 0.50–1.35)
GFR calc Af Amer: >90 mL/min (ref >90)
Glucose, Bld: 105 mg/dL — ABNORMAL HIGH (ref 70–99)
POTASSIUM: 3.3 mmol/L — AB (ref 3.5–5.1)
Sodium: 135 mmol/L (ref 135–145)

## 2014-11-20 MED ORDER — SULFAMETHOXAZOLE-TRIMETHOPRIM 800-160 MG PO TABS
1.0000 | ORAL_TABLET | Freq: Once | ORAL | Status: AC
  Administered 2014-11-20: 1 via ORAL
  Filled 2014-11-20: qty 1

## 2014-11-20 MED ORDER — IOHEXOL 300 MG/ML  SOLN
100.0000 mL | Freq: Once | INTRAMUSCULAR | Status: AC | PRN
  Administered 2014-11-20: 100 mL via INTRAVENOUS

## 2014-11-20 MED ORDER — POTASSIUM CHLORIDE CRYS ER 20 MEQ PO TBCR
40.0000 meq | EXTENDED_RELEASE_TABLET | Freq: Once | ORAL | Status: AC
  Administered 2014-11-20: 40 meq via ORAL
  Filled 2014-11-20: qty 2

## 2014-11-20 MED ORDER — HYDROCODONE-ACETAMINOPHEN 5-325 MG PO TABS: 2.0000 | ORAL_TABLET | ORAL | Status: DC | PRN

## 2014-11-20 MED ORDER — CEPHALEXIN 250 MG PO CAPS
500.0000 mg | ORAL_CAPSULE | Freq: Once | ORAL | Status: AC
  Administered 2014-11-20: 500 mg via ORAL
  Filled 2014-11-20: qty 2

## 2014-11-20 MED ORDER — CEPHALEXIN 500 MG PO CAPS: 500.0000 mg | ORAL_CAPSULE | Freq: Three times a day (TID) | ORAL | Status: DC

## 2014-11-20 MED ORDER — SULFAMETHOXAZOLE-TRIMETHOPRIM 800-160 MG PO TABS: 1.0000 | ORAL_TABLET | Freq: Two times a day (BID) | ORAL | Status: DC

## 2014-11-20 MED ORDER — ACETAMINOPHEN 500 MG PO TABS
1000.0000 mg | ORAL_TABLET | Freq: Once | ORAL | Status: AC
  Administered 2014-11-20: 1000 mg via ORAL
  Filled 2014-11-20: qty 2

## 2014-11-20 NOTE — ED Notes (Signed)
C/o right arm pain and swelling since Tuesday. Unable to sleep last night

## 2014-11-20 NOTE — ED Notes (Addendum)
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 Swelling since Tuesday

## 2014-11-20 NOTE — Discharge Instructions (Signed)
Cellulitis Take Tylenol or Advil for mild pain or the pain medicine prescribed for bad pain. Get your arm rechecked in 3 days at an urgent care center or return here. Elevate your arm above your heart as much as possible. Return if your condition worsens for any reason. [facility] tomorrow to get a primary care physician Cellulitis is an infection of the skin and the tissue under the skin. The infected area is usually red and tender. This happens most often in the arms and lower legs. HOME CARE   Take your antibiotic medicine as told. Finish the medicine even if you start to feel better.  Keep the infected arm or leg raised (elevated).  Put a warm cloth on the area up to 4 times per day.  Only take medicines as told by your doctor.  Keep all doctor visits as told. GET HELP IF:  You see red streaks on the skin coming from the infected area.  Your red area gets bigger or turns a dark color.  Your bone or joint under the infected area is painful after the skin heals.  Your infection comes back in the same area or different area.  You have a puffy (swollen) bump in the infected area.  You have new symptoms.  You have a fever. GET HELP RIGHT AWAY IF:   You feel very sleepy.  You throw up (vomit) or have watery poop (diarrhea).  You feel sick and have muscle aches and pains. MAKE SURE YOU:   Understand these instructions.  Will watch your condition.  Will get help right away if you are not doing well or get worse. Document Released: 02/22/2008 Document Revised: 01/20/2014 Document Reviewed: 11/21/2011 [facility] Patient Information 2015 [place]. This information is not intended to replace advice given to you by your health care provider. Make sure you discuss any questions you have with your health care provider.

## 2014-11-20 NOTE — ED Provider Notes (Signed)
CSN: [id number]     Arrival date & time 11/20/14  4098 History   First MD Initiated Contact with Patient 11/20/14 1114     Chief Complaint  Patient presents with  . Arm Pain     (Consider location/radiation/quality/duration/timing/severity/associated sxs/prior Treatment) Patient is a 24 y.o. male presenting with arm pain.  Arm Pain   Plains of right arm pain and swelling onset 2 days ago. No injury. No fever. No other associated symptoms. Pain is worse with at the elbow. It is located mostly at forearm but slightly above the elbow just proximal to the elbow. No other associated symptoms. He treated himself with Benadryl last night, without relief. No other associated symptoms no chest pain no shortness of breath . No numbness  History reviewed. No pertinent past medical history. past medical history negative History reviewed. No pertinent past surgical history. No family history on file. History  Substance Use Topics  . Smoking status: Never Smoker   . Smokeless tobacco: Not on file  . Alcohol Use: Yes    Review of Systems  Musculoskeletal: Positive for myalgias.       Right arm pain      Allergies  Review of patient's allergies indicates no known allergies.  Home Medications   Prior to Admission medications   Medication Sig Start Date End Date Taking? Authorizing Provider  amoxicillin (AMOXIL) 500 MG capsule Take 2 capsules (1,000 mg total) by mouth 3 (three) times daily. 02/21/14   Reuben Likes, MD  guaiFENesin-codeine [facility]) 100-10 MG/5ML syrup Take 10 mLs by mouth 4 (four) times daily as needed for cough. 02/21/14   Reuben Likes, MD  HYDROcodone-acetaminophen (NORCO/VICODIN) 5-325 MG per tablet 1 to 2 tabs every 4 to 6 hours as needed for pain. 04/22/14   Charm Rings, MD   BP 131/75 mmHg  Pulse 93  Temp(Src) 97.9 F (36.6 C) (Oral)  Resp 16  Ht 6' (1.829 m)  Wt 190 lb (86.183 kg)  BMI 25.76 kg/m2  SpO2 100% Physical Exam  Constitutional: He appears  well-developed and well-nourished.  HENT:  Head: Normocephalic and atraumatic.  Eyes: Conjunctivae are normal. Pupils are equal, round, and reactive to light.  Neck: Neck supple. No tracheal deviation present. No thyromegaly present.  Cardiovascular: Normal rate and regular rhythm.   No murmur heard. Pulmonary/Chest: Effort normal and breath sounds normal.  Abdominal: Soft. Bowel sounds are normal. He exhibits no distension. There is no tenderness.  Musculoskeletal: Normal range of motion. He exhibits edema. He exhibits no tenderness.  Right upper extremity swollen and tender and reddened at forearm and circumferential fashion. Upper arm is nontender red or swollen or warm. Radial pulse 2+. Full range of motion. Good capillary refill. All other extremities without redness, swelling or tenderness neurovascularly intact. No axillary nodes  Neurological: He is alert. Coordination normal.  Skin: Skin is warm and dry. No rash noted.  Psychiatric: He has a normal mood and affect.  Nursing note and vitals reviewed.   ED Course  Procedures (including critical care time) Labs Review Labs Reviewed - No data to display  Imaging Review No results found.   EKG Interpretation None     3 PM pain improved after treatment with Tylenol. Patient resting comfortably. Results for orders placed or performed during the hospital encounter of 11/20/14  D-dimer, quantitative  Result Value Ref Range   D-Dimer, Quant 0.27 0.00 - 0.48 ug/mL-FEU  CBC with Differential/Platelet  Result Value Ref Range   WBC  9.0 4.0 - 10.5 K/uL   RBC 5.04 4.22 - 5.81 MIL/uL   Hemoglobin 14.7 13.0 - 17.0 g/dL   HCT 09.844.7 11.939.0 - 14.752.0 %   MCV 88.7 78.0 - 100.0 fL   MCH 29.2 26.0 - 34.0 pg   MCHC 32.9 30.0 - 36.0 g/dL   RDW 82.912.3 56.211.5 - 13.015.5 %   Platelets 303 150 - 400 K/uL   Neutrophils Relative % 66 43 - 77 %   Neutro Abs 6.0 1.7 - 7.7 K/uL   Lymphocytes Relative 25 12 - 46 %   Lymphs Abs 2.3 0.7 - 4.0 K/uL   Monocytes  Relative 8 3 - 12 %   Monocytes Absolute 0.7 0.1 - 1.0 K/uL   Eosinophils Relative 1 0 - 5 %   Eosinophils Absolute 0.1 0.0 - 0.7 K/uL   Basophils Relative 0 0 - 1 %   Basophils Absolute 0.0 0.0 - 0.1 K/uL  Basic metabolic panel  Result Value Ref Range   Sodium 135 135 - 145 mmol/L   Potassium 3.3 (L) 3.5 - 5.1 mmol/L   Chloride 104 96 - 112 mmol/L   CO2 28 19 - 32 mmol/L   Glucose, Bld 105 (H) 70 - 99 mg/dL   BUN 9 6 - 23 mg/dL   Creatinine, Ser 8.650.90 0.50 - 1.35 mg/dL   Calcium 8.9 8.4 - 78.410.5 mg/dL   GFR calc non Af Amer >90 >90 mL/min   GFR calc Af Amer >90 >90 mL/min   Anion gap 3 (L) 5 - 15   Ct Forearm Right W Contrast  11/20/2014   CLINICAL DATA:  Right forearm pain with redness and swelling for 2 days.  EXAM: CT OF THE RIGHT FOREARM WITH CONTRAST  TECHNIQUE: Multidetector CT imaging was performed following the standard protocol during bolus administration of intravenous contrast.  CONTRAST:  100mL OMNIPAQUE IOHEXOL 300 MG/ML  SOLN  COMPARISON:  None.  FINDINGS: There is diffuse edema in the subcutaneous fat of the forearm primarily dorsal and medial. The osseous structures are normal. Muscle structures are normal. There is small focal area of short segment (2.5 cm) occlusion of the superficial vein on the dorsum of the distal forearm overlying the ulna. There is also a 6 cm focal area of what appears to be occlusionof a vein in the antecubital fossa.  No definable abscesses.  No joint effusions.  IMPRESSION: Extensive subcutaneous edema in the forearm, nonspecific but consistent with cellulitis.  Two focal areas what appear to be superficial venous occlusion as described.  No deep infection.  No abscesses.   Electronically Signed   By: Francene BoyersJames  Maxwell M.D.   On: 11/20/2014 13:48    MDM  Plan prescriptions Bactrim DS, Keflex, Norco. Recheck 3 days. [facility]erral [facility] Final diagnoses:  None   Diagnosis#1 cellulitis of right arm #2hypokalemia     Doug SouSam  Jermani Eberlein, MD 11/20/14 1558

## 2014-11-20 NOTE — ED Notes (Signed)
MD at bedside. 

## 2014-12-22 ENCOUNTER — Emergency Department (INDEPENDENT_AMBULATORY_CARE_PROVIDER_SITE_OTHER)
Admission: EM | Admit: 2014-12-22 | Discharge: 2014-12-22 | Disposition: A | Discharge status: Home / Self Care | Payer: Worker's Compensation | Source: Home / Self Care | Attending: Emergency Medicine | Admitting: Emergency Medicine

## 2014-12-22 ENCOUNTER — Encounter (HOSPITAL_COMMUNITY): Payer: Self-pay | Admitting: *Deleted

## 2014-12-22 DIAGNOSIS — L03114 Cellulitis of left upper limb: Secondary | ICD-10-CM

## 2014-12-22 MED ORDER — CEPHALEXIN 500 MG PO CAPS: 500.0000 mg | ORAL_CAPSULE | Freq: Four times a day (QID) | ORAL | Status: DC

## 2014-12-22 MED ORDER — IBUPROFEN 800 MG PO TABS: 800.0000 mg | ORAL_TABLET | Freq: Three times a day (TID) | ORAL | Status: DC | PRN

## 2014-12-22 MED ORDER — HYDROCODONE-ACETAMINOPHEN 5-325 MG PO TABS: 2.0000 | ORAL_TABLET | ORAL | Status: DC | PRN

## 2014-12-22 NOTE — ED Notes (Signed)
C/o L hand pain and swelling onset this afternoon.  No known injury.

## 2014-12-22 NOTE — ED Provider Notes (Signed)
CSN: [id number]     Arrival date & time 12/22/14  1916 History   First MD Initiated Contact with Patient 12/22/14 2013     No chief complaint on file. CC: left hand swelling  (Consider location/radiation/quality/duration/timing/severity/associated sxs/prior Treatment) HPI He is a 24 year old man here for evaluation of left hand swelling. He states something bit him this morning, but he doesn't know what. The swelling started this evening. It is across the dorsum of his hand. It is painful to make a fist. He also reports feeling achy. He had a similar event last month in the right forearm that was treated with antibiotics.  No past medical history on file. No past surgical history on file. No family history on file. History  Substance Use Topics  . Smoking status: Never Smoker   . Smokeless tobacco: Not on file  . Alcohol Use: Yes    Review of Systems As in history of present illness Allergies  Review of patient's allergies indicates no known allergies.  Home Medications   Prior to Admission medications   Medication Sig Start Date End Date Taking? Authorizing Provider  amoxicillin (AMOXIL) 500 MG capsule Take 2 capsules (1,000 mg total) by mouth 3 (three) times daily. 02/21/14   Reuben Likesavid C Keller, MD  cephALEXin (KEFLEX) 500 MG capsule Take 1 capsule (500 mg total) by mouth 4 (four) times daily. 12/22/14   Charm RingsErin J Kariya Lavergne, MD  guaiFENesin-codeine (GUIATUSS AC) 100-10 MG/5ML syrup Take 10 mLs by mouth 4 (four) times daily as needed for cough. 02/21/14   Reuben Likesavid C Keller, MD  HYDROcodone-acetaminophen (NORCO) 5-325 MG per tablet Take 2 tablets by mouth every 4 (four) hours as needed for moderate pain. 12/22/14   Charm RingsErin J Shanty Ginty, MD  ibuprofen (ADVIL,MOTRIN) 800 MG tablet Take 1 tablet (800 mg total) by mouth every 8 (eight) hours as needed for mild pain. 12/22/14   Charm RingsErin J Emari Demmer, MD  sulfamethoxazole-trimethoprim (SEPTRA DS) 800-160 MG per tablet Take 1 tablet by mouth 2 (two) times daily. 11/20/14   Doug SouSam  Jacubowitz, MD   BP 127/82 mmHg  Pulse 94  Temp(Src) 99.9 F (37.7 C) (Oral)  Resp 14  SpO2 100% Physical Exam  Constitutional: He is oriented to person, place, and time. He appears well-developed and well-nourished. No distress.  Cardiovascular: Normal rate.   Pulmonary/Chest: Effort normal.  Musculoskeletal:  Left hand has swelling across the dorsal aspect.  There are 2 papules, but no fluctuance. It is warm and mildly erythematous. It does not involve the digits. He has brisk cap refill.  Neurological: He is oriented to person, place, and time.    ED Course  Procedures (including critical care time) Labs Review Labs Reviewed - No data to display  Imaging Review No results found.   MDM   1. Cellulitis of left hand excluding fingers and thumb    We'll treat with Keflex. Ibuprofen and Norco provided for pain. Follow-up here in 2 days for recheck. If symptoms are worsening, he will go to the emergency room.     Charm RingsErin J Esco Joslyn, MD 12/22/14 2031

## 2014-12-22 NOTE — Discharge Instructions (Signed)
I am worried that you have an infection in your hand. Take Keflex 4 times a day for 10 days. Use ibuprofen 3 times a day as needed for pain. Use the Norco every 4 hours as needed for severe pain. Do not drive if you're taking this medicine. Follow-up here in 2 days for a recheck. If the swelling is getting worse, please go to the emergency room.

## 2015-09-19 ENCOUNTER — Encounter (HOSPITAL_COMMUNITY): Payer: Self-pay | Admitting: Emergency Medicine

## 2015-09-19 ENCOUNTER — Emergency Department (INDEPENDENT_AMBULATORY_CARE_PROVIDER_SITE_OTHER)
Admission: EM | Admit: 2015-09-19 | Discharge: 2015-09-19 | Disposition: A | Discharge status: Home / Self Care | Payer: Self-pay | Source: Home / Self Care

## 2015-09-19 DIAGNOSIS — R51 Headache: Secondary | ICD-10-CM

## 2015-09-19 DIAGNOSIS — R519 Headache, unspecified: Secondary | ICD-10-CM

## 2015-09-19 MED ORDER — KETOROLAC TROMETHAMINE 30 MG/ML IJ SOLN
30.0000 mg | Freq: Once | INTRAMUSCULAR | Status: AC
  Administered 2015-09-19: 30 mg via INTRAMUSCULAR

## 2015-09-19 MED ORDER — KETOROLAC TROMETHAMINE 30 MG/ML IJ SOLN
INTRAMUSCULAR | Status: AC
  Filled 2015-09-19: qty 1

## 2015-09-19 NOTE — ED Provider Notes (Addendum)
CSN: [id number]     Arrival date & time 09/19/15  1552 History   None    Chief Complaint  Patient presents with  . Headache   (Consider location/radiation/quality/duration/timing/severity/associated sxs/prior Treatment) HPI Headaches, often usually take ibuprofen and symptoms resolve.  Not working today. Pain score 8.  No nausea or vision changes History reviewed. No pertinent past medical history. History reviewed. No pertinent past surgical history. History reviewed. No pertinent family history. Social History  Substance Use Topics  . Smoking status: Never Smoker   . Smokeless tobacco: None  . Alcohol Use: Yes     Comment: occasional    Review of Systems  Allergies  Review of patient's allergies indicates no known allergies.  Home Medications   Prior to Admission medications   Medication Sig Start Date End Date Taking? Authorizing Provider  amoxicillin (AMOXIL) 500 MG capsule Take 2 capsules (1,000 mg total) by mouth 3 (three) times daily. 02/21/14   Reuben Likes, MD  cephALEXin (KEFLEX) 500 MG capsule Take 1 capsule (500 mg total) by mouth 4 (four) times daily. 12/22/14   Charm Rings, MD  guaiFENesin-codeine (GUIATUSS AC) 100-10 MG/5ML syrup Take 10 mLs by mouth 4 (four) times daily as needed for cough. 02/21/14   Reuben Likes, MD  HYDROcodone-acetaminophen (NORCO) 5-325 MG per tablet Take 2 tablets by mouth every 4 (four) hours as needed for moderate pain. 12/22/14   Charm Rings, MD  ibuprofen (ADVIL,MOTRIN) 800 MG tablet Take 1 tablet (800 mg total) by mouth every 8 (eight) hours as needed for mild pain. 12/22/14   Charm Rings, MD  sulfamethoxazole-trimethoprim (SEPTRA DS) 800-160 MG per tablet Take 1 tablet by mouth 2 (two) times daily. 11/20/14   Doug Sou, MD   Meds Ordered and Administered this Visit   Medications  ketorolac (TORADOL) 30 MG/ML injection 30 mg (30 mg Intramuscular Given 09/19/15 1707)    BP 128/89 mmHg  Pulse 76  Temp(Src) 98.2 F (36.8 C)  (Oral)  Resp 18  SpO2 99% No data found.   Physical Exam  Constitutional: He is oriented to person, place, and time. He appears well-developed and well-nourished.  HENT:  Head: Normocephalic and atraumatic.  Right Ear: External ear normal.  Left Ear: External ear normal.  Mouth/Throat: Oropharynx is clear and moist.  Eyes: Conjunctivae and EOM are normal. Pupils are equal, round, and reactive to light.  Fundoscopic exam:      The right eye shows venous pulsations.       The left eye shows venous pulsations.  Neck: Normal range of motion. Neck supple.  Cardiovascular: Normal rate.   Pulmonary/Chest: Effort normal and breath sounds normal.  Abdominal: Soft.  Musculoskeletal: Normal range of motion.  Neurological: He is alert and oriented to person, place, and time.  Skin: Skin is warm and dry.  Psychiatric: He has a normal mood and affect. His behavior is normal. Judgment and thought content normal.  Nursing note and vitals reviewed.   ED Course  Procedures (including critical care time)  Labs Review Labs Reviewed - No data to display  Imaging Review No results found.   Visual Acuity Review  Right Eye Distance:   Left Eye Distance:   Bilateral Distance:    Right Eye Near:   Left Eye Near:    Bilateral Near:         MDM   1. Nonintractable headache, unspecified chronicity pattern, unspecified headache type    Patient is advised to continue home  symptomatic treatment.  Patient is advised that if there are new or worsening symptoms or attend the emergency department, or contact primary care provider. Instructions of care provided discharged home in stable condition.  THIS NOTE WAS GENERATED USING A VOICE RECOGNITION SOFTWARE PROGRAM. ALL REASONABLE EFFORTS  WERE MADE TO PROOFREAD THIS DOCUMENT FOR ACCURACY.     Tharon AquasFrank C Anett Ranker, PA 09/19/15 1719  Tharon AquasFrank C Willette Mudry, PA 10/26/15 1318

## 2015-09-19 NOTE — ED Notes (Signed)
[facility] with a [facility] of a headache that started this morning.

## 2015-09-19 NOTE — Discharge Instructions (Signed)

## 2015-10-19 IMAGING — CT CT FOREARM*R* W/CM
3 series · 11 of 33 positions shown, 13 images · IV contrast (omnipaque)
Comparison: None.

CLINICAL DATA: Right forearm pain with redness and swelling for 2
days.

EXAM:
CT OF THE RIGHT FOREARM WITH CONTRAST
TECHNIQUE: Multidetector CT imaging was performed following the standard
protocol during bolus administration of intravenous contrast.
CONTRAST:  100mL OMNIPAQUE IOHEXOL 300 MG/ML  SOLN

[Series 5: elbow 2.0 b31s · axial · 0.35mm/px · z∈[-350,-82]mm · 3 of 219 slices shown, 4 images]
[im 51/219  soft-tissue]
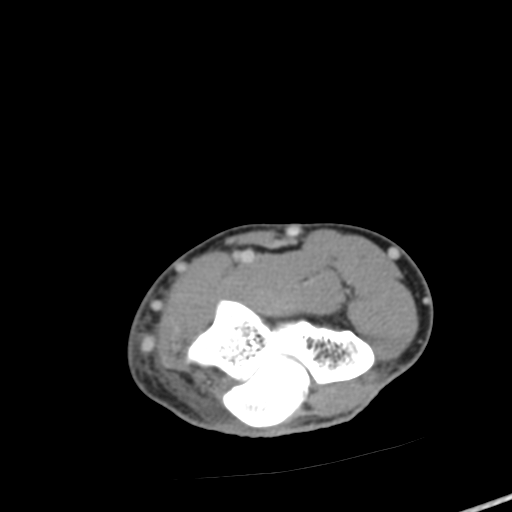
[im 51/219  bone]
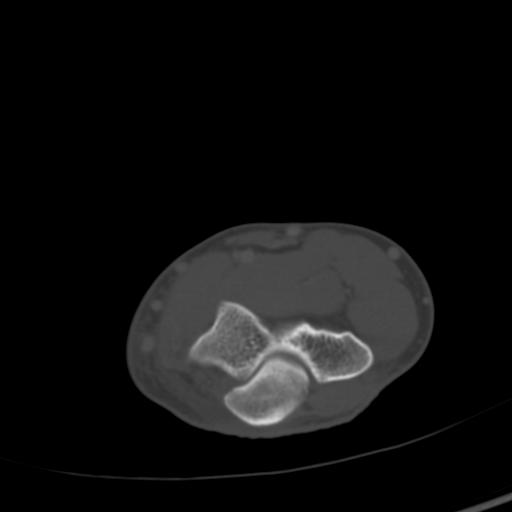
[im 118/219  bone]
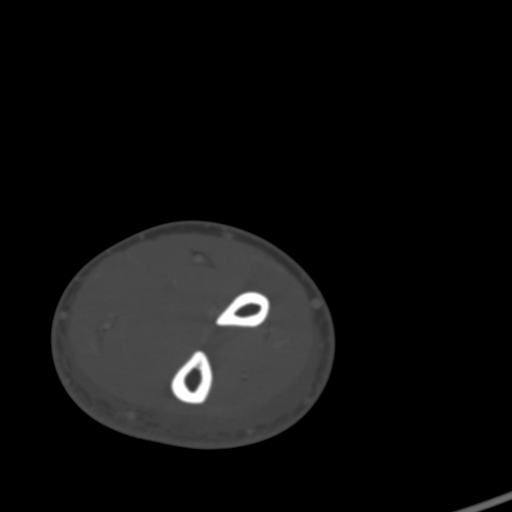
[im 185/219  bone]
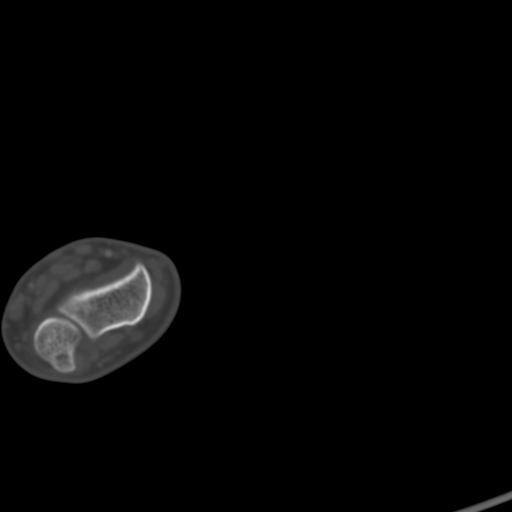

[Series 9: elbow 2.0 coronal · coronal · 0.52mm/px · 3 of 49 slices shown]
[im 10/49  bone]
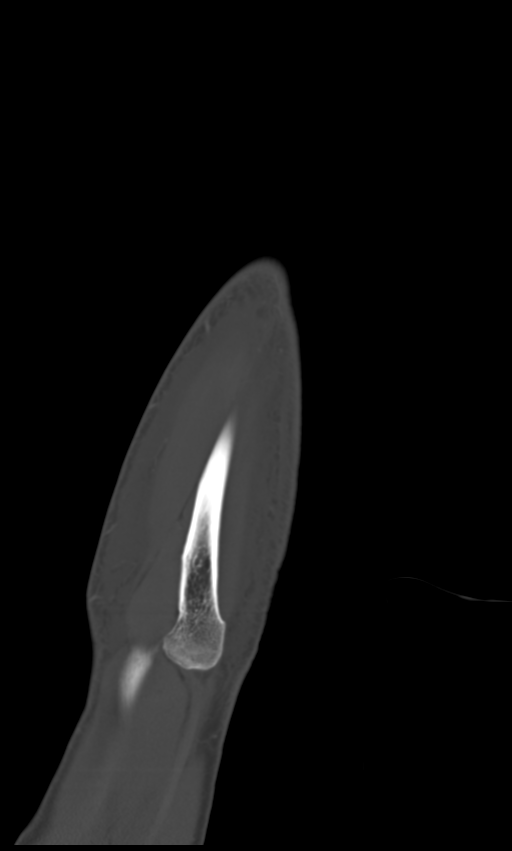
[im 20/49  bone]
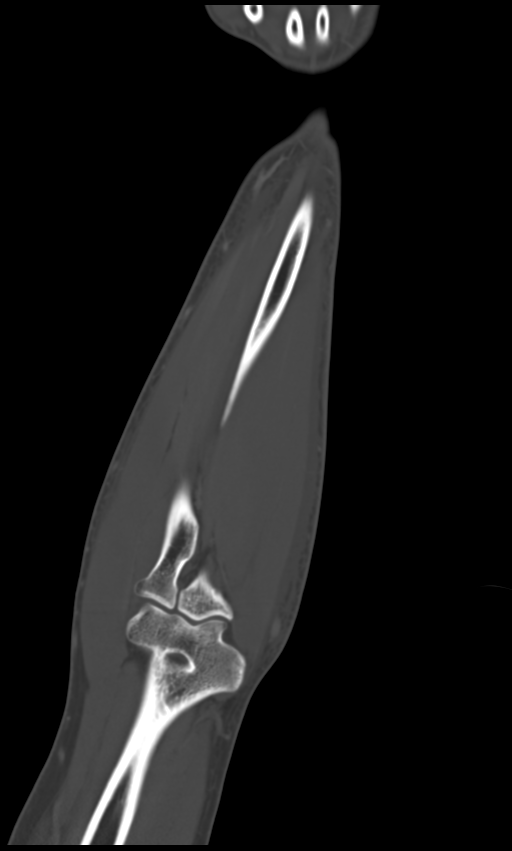
[im 29/49  bone]
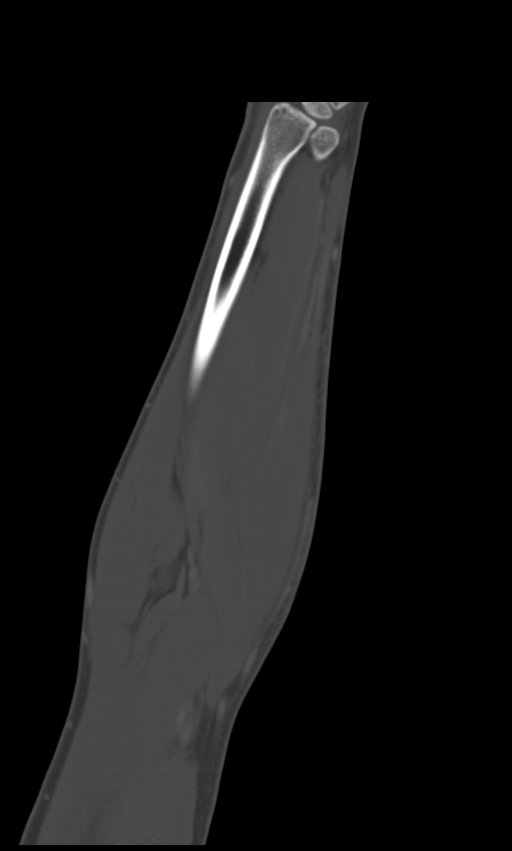

[Series 10: elbow 2.0 sagittal · sagittal · 0.46mm/px · 5 of 75 slices shown, 6 images]
[im 25/75  bone]
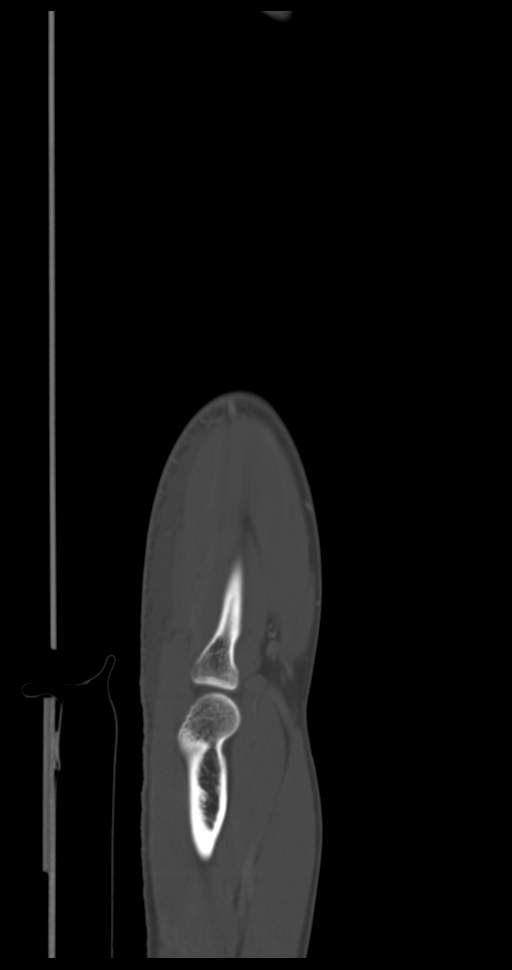
[im 31/75  bone]
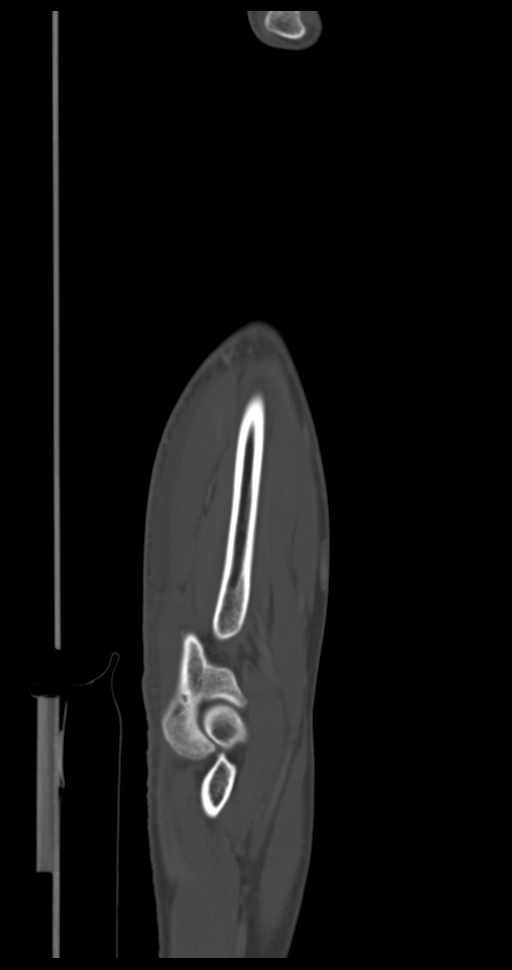
[im 38/75  soft-tissue]
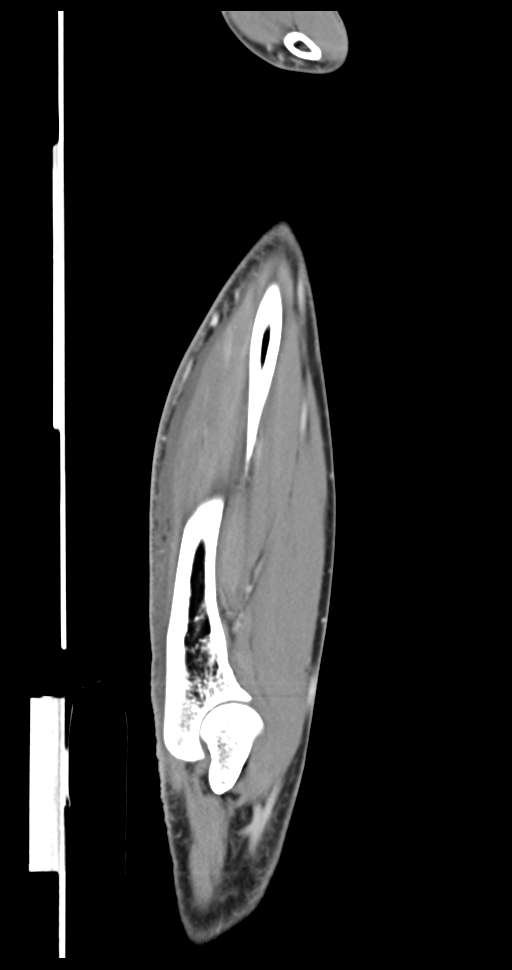
[im 38/75  bone]
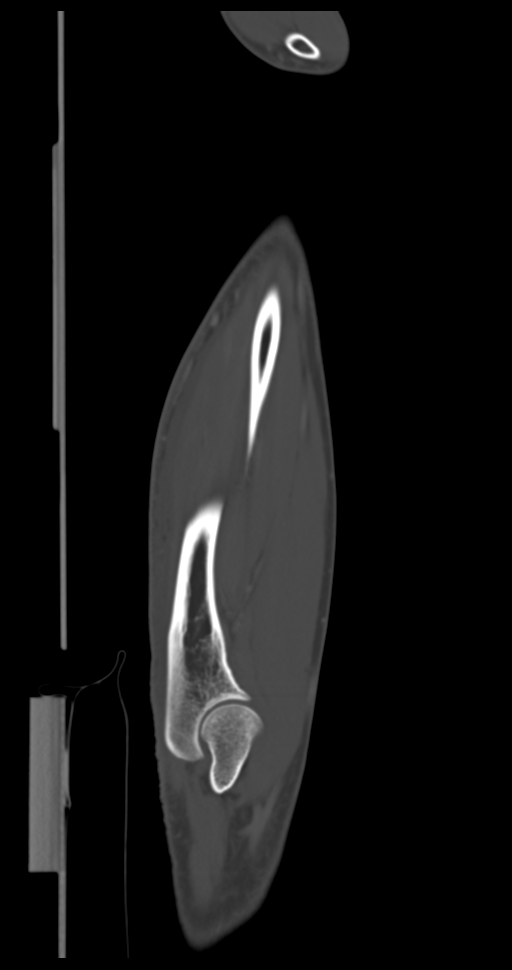
[im 44/75  bone]
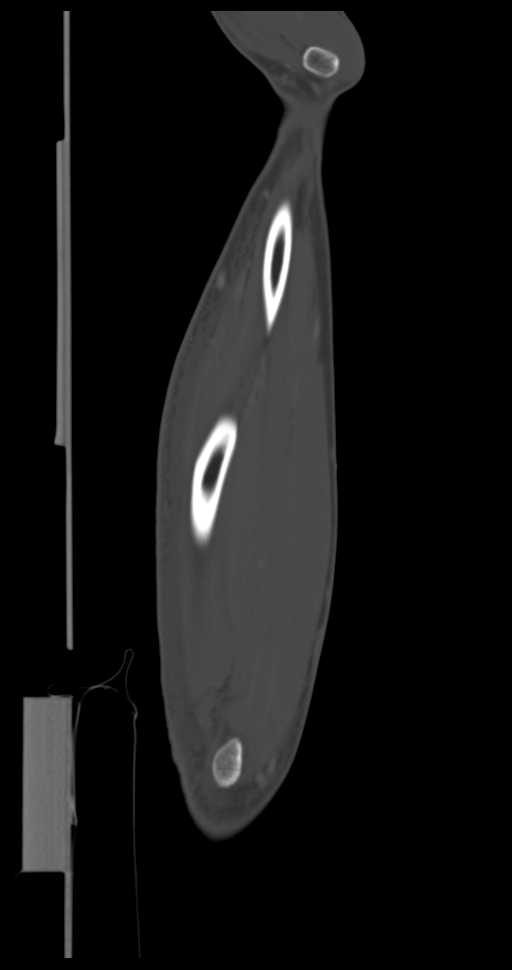
[im 50/75  bone]
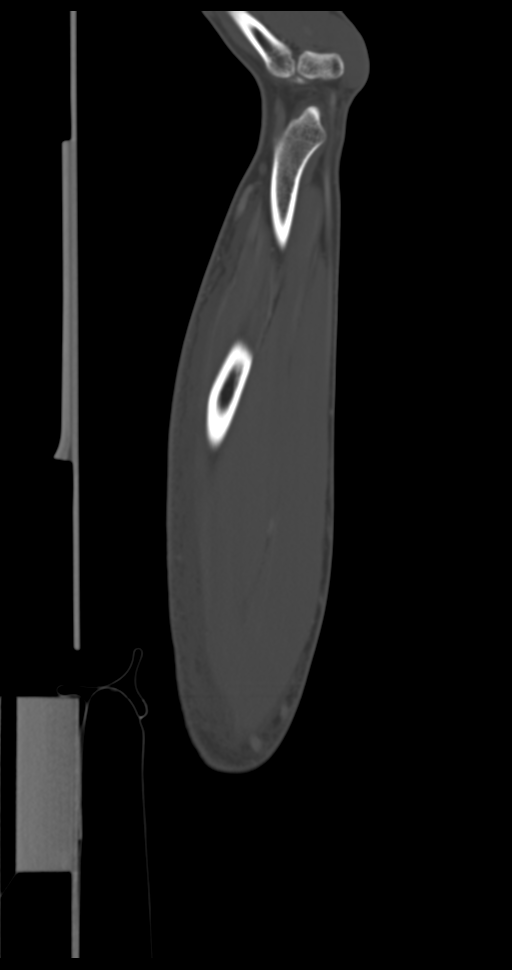

[11 of 33 positions shown; findings below may reference images not displayed]

FINDINGS: There is diffuse edema in the subcutaneous fat of the forearm
primarily dorsal and medial. The osseous structures are normal.
Muscle structures are normal. There is small focal area of short
segment (2.5 cm) occlusion of the superficial vein on the dorsum of
the distal forearm overlying the ulna. There is also a 6 cm focal
area of what appears to be occlusionof a vein in the antecubital
fossa.

No definable abscesses.  No joint effusions.
IMPRESSION: Extensive subcutaneous edema in the forearm, nonspecific but
consistent with cellulitis.

Two focal areas what appear to be superficial venous occlusion as
described.

No deep infection.  No abscesses.

## 2016-01-24 ENCOUNTER — Emergency Department (HOSPITAL_COMMUNITY)
Admission: EM | Admit: 2016-01-24 | Discharge: 2016-01-24 | Disposition: A | Discharge status: Home / Self Care | Payer: Self-pay | Attending: Emergency Medicine | Admitting: Emergency Medicine

## 2016-01-24 ENCOUNTER — Encounter (HOSPITAL_COMMUNITY): Payer: Self-pay | Admitting: *Deleted

## 2016-01-24 DIAGNOSIS — Z79899 Other long term (current) drug therapy: Secondary | ICD-10-CM | POA: Insufficient documentation

## 2016-01-24 DIAGNOSIS — R110 Nausea: Secondary | ICD-10-CM | POA: Insufficient documentation

## 2016-01-24 LAB — CBC
HCT: 44.2 % (ref 39.0–52.0)
HEMOGLOBIN: 14.2 g/dL (ref 13.0–17.0)
MCH: 28.2 pg (ref 26.0–34.0)
MCHC: 32.1 g/dL (ref 30.0–36.0)
MCV: 87.9 fL (ref 78.0–100.0)
Platelets: 281 10*3/uL (ref 150–400)
RBC: 5.03 MIL/uL (ref 4.22–5.81)
RDW: 12 % (ref 11.5–15.5)
WBC: 6.6 10*3/uL (ref 4.0–10.5)

## 2016-01-24 LAB — BASIC METABOLIC PANEL
Anion gap: 12 (ref 5–15)
BUN: 7 mg/dL (ref 6–20)
CHLORIDE: 102 mmol/L (ref 101–111)
CO2: 26 mmol/L (ref 22–32)
CREATININE: 1.01 mg/dL (ref 0.61–1.24)
Calcium: 9.8 mg/dL (ref 8.9–10.3)
Glucose, Bld: 121 mg/dL — ABNORMAL HIGH (ref 65–99)
Potassium: 4.1 mmol/L (ref 3.5–5.1)
SODIUM: 140 mmol/L (ref 135–145)

## 2016-01-24 LAB — LIPASE, BLOOD: Lipase: 22 U/L (ref 11–51)

## 2016-01-24 MED ORDER — ONDANSETRON HCL 4 MG PO TABS: 4.0000 mg | ORAL_TABLET | Freq: Four times a day (QID) | ORAL | Status: DC

## 2016-01-24 MED ORDER — METOCLOPRAMIDE HCL 5 MG/ML IJ SOLN
10.0000 mg | Freq: Once | INTRAMUSCULAR | Status: AC
  Administered 2016-01-24: 10 mg via INTRAVENOUS
  Filled 2016-01-24: qty 2

## 2016-01-24 MED ORDER — SODIUM CHLORIDE 0.9 % IV BOLUS (SEPSIS)
1000.0000 mL | Freq: Once | INTRAVENOUS | Status: AC
  Administered 2016-01-24: 1000 mL via INTRAVENOUS

## 2016-01-24 NOTE — ED Notes (Signed)
Pt presents via POV c/o nausea.  Denies abdominal pain/vomiting or diarrhea.  Denies fever or sick contacts.  A x 4, NAD.

## 2016-01-24 NOTE — Discharge Instructions (Signed)
Mr. Jeffrey Peck,  Nice meeting you! Please follow-up with your primary care provider. Return to the emergency department if you develop fevers, chills, are unable to keep foods down, have increasing abdominal pain, new/worsening symptoms. Feel better soon!  S. Lane HackerNicole Kotaro Buer, PA-C Nausea, Adult Nausea is the feeling that you have an upset stomach or have to vomit. Nausea by itself is not likely a serious concern, but it may be an early sign of more serious medical problems. As nausea gets worse, it can lead to vomiting. If vomiting develops, there is the risk of dehydration.  CAUSES   Viral infections.  Food poisoning.  Medicines.  Pregnancy.  Motion sickness.  Migraine headaches.  Emotional distress.  Severe pain from any source.  Alcohol intoxication. HOME CARE INSTRUCTIONS  Get plenty of rest.  Ask your caregiver about specific rehydration instructions.  Eat small amounts of food and sip liquids more often.  Take all medicines as told by your caregiver. SEEK MEDICAL CARE IF:  You have not improved after 2 days, or you get worse.  You have a headache. SEEK IMMEDIATE MEDICAL CARE IF:   You have a fever.  You faint.  You keep vomiting or have blood in your vomit.  You are extremely weak or dehydrated.  You have dark or bloody stools.  You have severe chest or abdominal pain. MAKE SURE YOU:  Understand these instructions.  Will watch your condition.  Will get help right away if you are not doing well or get worse.   This information is not intended to replace advice given to you by your health care provider. Make sure you discuss any questions you have with your health care provider.   Document Released: 10/13/2004 Document Revised: 09/26/2014 Document Reviewed: 05/18/2011 Elsevier Interactive Patient Education [employer]2016 [employer] Inc.

## 2016-02-01 NOTE — ED Provider Notes (Signed)
CSN: [id number]     Arrival date & time 01/24/16  1202 History   First MD Initiated Contact with Patient 01/24/16 1306     Chief Complaint  Patient presents with  . Nausea   HPI   Jeffrey Peck is a 25 y.o. male with no significant PMH presenting with a 1 day history of nausea. He states he drank heavily last night. He denies abdominal pain, vomiting, diarrhea, fevers, chills, CP, SOB, palpitations.    History reviewed. No pertinent past medical history. History reviewed. No pertinent past surgical history. No family history on file. Social History  Substance Use Topics  . Smoking status: Never Smoker   . Smokeless tobacco: None  . Alcohol Use: Yes     Comment: occasional    Review of Systems  Ten systems are reviewed and are negative for acute change except as noted in the HPI  Allergies  Review of patient's allergies indicates no known allergies.  Home Medications   Prior to Admission medications   Medication Sig Start Date End Date Taking? Authorizing Provider  fluocinonide (LIDEX) 0.05 % external solution Apply 1 application topically 2 (two) times daily.   Yes Historical Provider, MD  ketoconazole (NIZORAL) 2 % shampoo Apply 1 application topically as needed for irritation.   Yes Historical Provider, MD  amoxicillin (AMOXIL) 500 MG capsule Take 2 capsules (1,000 mg total) by mouth 3 (three) times daily. Patient not taking: Reported on 01/24/2016 02/21/14   Reuben Likes, MD  cephALEXin (KEFLEX) 500 MG capsule Take 1 capsule (500 mg total) by mouth 4 (four) times daily. Patient not taking: Reported on 01/24/2016 12/22/14   Charm Rings, MD  guaiFENesin-codeine [facility]) 100-10 MG/5ML syrup Take 10 mLs by mouth 4 (four) times daily as needed for cough. Patient not taking: Reported on 01/24/2016 02/21/14   Reuben Likes, MD  HYDROcodone-acetaminophen [facility]) 5-325 MG per tablet Take 2 tablets by mouth every 4 (four) hours as needed for moderate pain. Patient not taking: Reported  on 01/24/2016 12/22/14   Charm Rings, MD  ibuprofen (ADVIL,MOTRIN) 800 MG tablet Take 1 tablet (800 mg total) by mouth every 8 (eight) hours as needed for mild pain. Patient not taking: Reported on 01/24/2016 12/22/14   Charm Rings, MD  ondansetron (ZOFRAN) 4 MG tablet Take 1 tablet (4 mg total) by mouth every 6 (six) hours. 01/24/16   Melton Krebs, PA-C  sulfamethoxazole-trimethoprim (SEPTRA DS) 800-160 MG per tablet Take 1 tablet by mouth 2 (two) times daily. Patient not taking: Reported on 01/24/2016 11/20/14   Doug Sou, MD   BP 134/84 mmHg  Pulse 70  Resp 18  SpO2 100% Physical Exam  Constitutional: He appears well-developed and well-nourished. No distress.  HENT:  Head: Normocephalic and atraumatic.  Mouth/Throat: Oropharynx is clear and moist. No oropharyngeal exudate.  Eyes: Conjunctivae are normal. Pupils are equal, round, and reactive to light. Right eye exhibits no discharge. Left eye exhibits no discharge. No scleral icterus.  Neck: No tracheal deviation present.  Cardiovascular: Normal rate, regular rhythm, normal heart sounds and intact distal pulses.  Exam reveals no gallop and no friction rub.   No murmur heard. Pulmonary/Chest: Effort normal and breath sounds normal. No respiratory distress. He has no wheezes. He has no rales. He exhibits no tenderness.  Abdominal: Soft. Bowel sounds are normal. He exhibits no distension and no mass. There is no tenderness. There is no rebound and no guarding.  Musculoskeletal: He exhibits no edema.  Lymphadenopathy:    He has no cervical adenopathy.  Neurological: He is alert. Coordination normal.  Skin: Skin is warm and dry. No rash noted. He is not diaphoretic. No erythema.  Psychiatric: He has a normal mood and affect. His behavior is normal.  Nursing note and vitals reviewed.   ED Course  Procedures  Labs Review Labs Reviewed  BASIC METABOLIC PANEL - Abnormal; Notable for the following:    Glucose, Bld 121 (*)    All other  components within normal limits  CBC  LIPASE, BLOOD    MDM   Final diagnoses:  Nausea   Patient nontoxic appearing, VSS. Lipase, BMP, CBC unremarkable. Based on patient history and physical exam, most likely etiologies are gastroenteritis vs alcohol intoxication. Less likely etiologies are obstruction, appendicitis, cholecystitis, pyelonephritis, kidney stones, food poisoning, ischemia, perforation, torsion, vertigo (cerebellar, vertebrobasilar, vestibular), meningitis, increased intracranial pressure, intracranial bleed, migraine, tumors, systemic infection, dehydration, hypo/hyperglycemia, hyponatremia, acidosis (DKA, AKA), cardiac ischemia.  Patient improved after IV fluids and reglan. Patient may be safely discharged home. Discussed reasons for return. Patient to follow-up with primary care provider within one week. Patient in understanding and agreement with the plan.   Melton KrebsSamantha Nicole Tharon Bomar, PA-C 02/01/16 [phone]  Pricilla LovelessScott Goldston, MD 02/02/16 [phone]

## 2016-04-05 ENCOUNTER — Ambulatory Visit
Admission: RE | Admit: 2016-04-05 | Discharge: 2016-04-05 | Disposition: A | Discharge status: Home / Self Care | Payer: Self-pay | Source: Ambulatory Visit | Attending: Family Medicine | Admitting: Family Medicine

## 2016-04-05 ENCOUNTER — Other Ambulatory Visit (HOSPITAL_COMMUNITY): Payer: Self-pay | Admitting: Family Medicine

## 2016-04-05 DIAGNOSIS — R7611 Nonspecific reaction to tuberculin skin test without active tuberculosis: Secondary | ICD-10-CM

## 2016-11-01 ENCOUNTER — Encounter (HOSPITAL_COMMUNITY): Payer: Self-pay | Admitting: Emergency Medicine

## 2016-11-01 ENCOUNTER — Ambulatory Visit (HOSPITAL_COMMUNITY)
Admission: EM | Admit: 2016-11-01 | Discharge: 2016-11-01 | Disposition: A | Discharge status: Home / Self Care | Payer: Self-pay | Attending: Family Medicine | Admitting: Family Medicine

## 2016-11-01 DIAGNOSIS — G4489 Other headache syndrome: Secondary | ICD-10-CM

## 2016-11-01 DIAGNOSIS — R6889 Other general symptoms and signs: Secondary | ICD-10-CM

## 2016-11-01 MED ORDER — OSELTAMIVIR PHOSPHATE 75 MG PO CAPS: 75.0000 mg | ORAL_CAPSULE | Freq: Two times a day (BID) | ORAL | 0 refills | Status: DC

## 2016-11-01 NOTE — ED Triage Notes (Signed)
[facility] a [facility] of chills, body aches and a headache x3 days.

## 2016-11-01 NOTE — ED Provider Notes (Signed)
CSN: [id number]     Arrival date & time 11/01/16  1004 History   First MD Initiated Contact with Patient 11/01/16 1113     Chief Complaint  Patient presents with  . Headache   (Consider location/radiation/quality/duration/timing/severity/associated sxs/prior Treatment) Patient c/o chills, body aches, headache going on 3rd day.   The history is provided by the patient.  Headache  Pain location:  Generalized Radiates to:  Does not radiate Severity currently:  4/10 Severity at highest:  5/10 Duration:  3 days Timing:  Constant Chronicity:  New Similar to prior headaches: no   Relieved by:  Nothing Associated symptoms: fatigue and fever     History reviewed. No pertinent past medical history. History reviewed. No pertinent surgical history. History reviewed. No pertinent family history. Social History  Substance Use Topics  . Smoking status: Never Smoker  . Smokeless tobacco: Not on file  . Alcohol use Yes     Comment: occasional    Review of Systems  Constitutional: Positive for fatigue and fever.  HENT: Negative.   Eyes: Negative.   Respiratory: Negative.   Cardiovascular: Negative.   Gastrointestinal: Negative.   Endocrine: Negative.   Genitourinary: Negative.   Musculoskeletal: Negative.   Allergic/Immunologic: Negative.   Neurological: Positive for headaches.  Hematological: Negative.   Psychiatric/Behavioral: Negative.     Allergies  Patient has no known allergies.  Home Medications   Prior to Admission medications   Medication Sig Start Date End Date Taking? Authorizing Provider  oseltamivir (TAMIFLU) 75 MG capsule Take 1 capsule (75 mg total) by mouth every 12 (twelve) hours. 11/01/16   Deatra CanterWilliam J Tayten Bergdoll, FNP   Meds Ordered and Administered this Visit  Medications - No data to display  BP 117/74 (BP Location: Right Arm)   Pulse 104   Temp 99.1 F (37.3 C) (Oral)   Resp 16   SpO2 98%  No data found.   Physical Exam  Constitutional: He appears  well-developed and well-nourished.  HENT:  Head: Normocephalic and atraumatic.  Right Ear: External ear normal.  Left Ear: External ear normal.  Mouth/Throat: Oropharynx is clear and moist.  Eyes: Conjunctivae and EOM are normal. Pupils are equal, round, and reactive to light.  Neck: Normal range of motion. Neck supple.  Cardiovascular: Normal rate, regular rhythm and normal heart sounds.   Pulmonary/Chest: Effort normal and breath sounds normal.  Abdominal: Soft.  Nursing note and vitals reviewed.   Urgent Care Course     Procedures (including critical care time)  Labs Review Labs Reviewed - No data to display  Imaging Review No results found.   Visual Acuity Review  Right Eye Distance:   Left Eye Distance:   Bilateral Distance:    Right Eye Near:   Left Eye Near:    Bilateral Near:         MDM   1. Flu-like symptoms   2. Other headache syndrome    Tamiflu 75mg  one po bid x 5 days  Push po fluids, rest, tylenol and motrin otc prn as directed for fever, arthralgias, and myalgias.  Follow up prn if sx's continue or persist.    Deatra CanterWilliam J Harjit Douds, FNP 11/01/16 1119

## 2016-11-01 NOTE — Discharge Instructions (Signed)
Take over the counter tylenol and motrin for headaches.

## 2016-12-12 ENCOUNTER — Ambulatory Visit (HOSPITAL_COMMUNITY)
Admission: EM | Admit: 2016-12-12 | Discharge: 2016-12-12 | Disposition: A | Discharge status: Home / Self Care | Payer: Self-pay | Attending: Internal Medicine | Admitting: Internal Medicine

## 2016-12-12 ENCOUNTER — Encounter (HOSPITAL_COMMUNITY): Payer: Self-pay | Admitting: Emergency Medicine

## 2016-12-12 DIAGNOSIS — R112 Nausea with vomiting, unspecified: Secondary | ICD-10-CM

## 2016-12-12 DIAGNOSIS — R197 Diarrhea, unspecified: Secondary | ICD-10-CM

## 2016-12-12 DIAGNOSIS — R1084 Generalized abdominal pain: Secondary | ICD-10-CM

## 2016-12-12 MED ORDER — ONDANSETRON 4 MG PO TBDP
ORAL_TABLET | ORAL | Status: AC
  Filled 2016-12-12: qty 2

## 2016-12-12 MED ORDER — ONDANSETRON HCL 4 MG PO TABS: 4.0000 mg | ORAL_TABLET | Freq: Four times a day (QID) | ORAL | 0 refills | Status: DC

## 2016-12-12 MED ORDER — ONDANSETRON 4 MG PO TBDP
8.0000 mg | ORAL_TABLET | Freq: Once | ORAL | Status: AC
  Administered 2016-12-12: 8 mg via ORAL

## 2016-12-12 NOTE — ED Provider Notes (Signed)
CSN: [id number]     Arrival date & time 12/12/16  1620 History   First MD Initiated Contact with Patient 12/12/16 1724     Chief Complaint  Patient presents with  . Nausea  . Emesis  . Chills   (Consider location/radiation/quality/duration/timing/severity/associated sxs/prior Treatment) HPI  JAYDEN KRATOCHVIL is a 26 y.o. male presenting to UC with c/o sudden onset n/v/d and chills that started this morning. He reports 2 episodes of vomiting today as well as 2-3 episodes of loose stool. Denies blood or mucous in the stool.  Denies known fever. Denies cough or congestion. No known sick contacts or recent travel. He has not tried anything for his symptoms.     History reviewed. No pertinent past medical history. History reviewed. No pertinent surgical history. History reviewed. No pertinent family history. Social History  Substance Use Topics  . Smoking status: Never Smoker  . Smokeless tobacco: Never Used  . Alcohol use Yes     Comment: occasional    Review of Systems  Constitutional: Positive for chills and fatigue. Negative for fever.  HENT: Negative for congestion, ear pain and sore throat.   Respiratory: Negative for cough and shortness of breath.   Cardiovascular: Negative for chest pain and palpitations.  Gastrointestinal: Positive for abdominal pain ( generalized cramping), diarrhea, nausea and vomiting.  Musculoskeletal: Negative for arthralgias and myalgias.  Neurological: Negative for dizziness, weakness, light-headedness and headaches.    Allergies  Patient has no known allergies.  Home Medications   Prior to Admission medications   Medication Sig Start Date End Date Taking? Authorizing Provider  ondansetron (ZOFRAN) 4 MG tablet Take 1 tablet (4 mg total) by mouth every 6 (six) hours. 12/12/16   Junius Finner, PA-C  oseltamivir (TAMIFLU) 75 MG capsule Take 1 capsule (75 mg total) by mouth every 12 (twelve) hours. 11/01/16   Deatra Canter, FNP   Meds Ordered and  Administered this Visit   Medications  ondansetron (ZOFRAN-ODT) disintegrating tablet 8 mg (8 mg Oral Given 12/12/16 1739)    BP 119/75 (BP Location: Right Arm)   Pulse 93   Temp 98.8 F (37.1 C) (Oral)   SpO2 99%  No data found.   Physical Exam  Constitutional: He is oriented to person, place, and time. He appears well-developed and well-nourished. No distress.  HENT:  Head: Normocephalic and atraumatic.  Right Ear: Tympanic membrane normal.  Left Ear: Tympanic membrane normal.  Nose: Nose normal.  Mouth/Throat: Uvula is midline, oropharynx is clear and moist and mucous membranes are normal.  Eyes: EOM are normal.  Neck: Normal range of motion. Neck supple.  Cardiovascular: Normal rate and regular rhythm.   Pulmonary/Chest: Effort normal and breath sounds normal. No stridor. No respiratory distress. He has no wheezes. He has no rales.  Abdominal: Soft. He exhibits no distension and no mass. There is no tenderness. There is no rebound, no guarding and no CVA tenderness.  Musculoskeletal: Normal range of motion.  Lymphadenopathy:    He has no cervical adenopathy.  Neurological: He is alert and oriented to person, place, and time.  Skin: Skin is warm and dry. He is not diaphoretic.  Psychiatric: He has a normal mood and affect. His behavior is normal.  Nursing note and vitals reviewed.   Urgent Care Course     Procedures (including critical care time)  Labs Review Labs Reviewed - No data to display  Imaging Review No results found.    MDM   1. Nausea vomiting and  diarrhea   2. Generalized abdominal cramping    No evidence of acute abdomen. Hx and exam c/w viral gastroenteritis.  Zofran given in UC. Pt able to keep down several ounces of PO fluids.  Rx: zofran Encouraged fluids and rest. f/u with PCP as needed.   Junius Finnerrin O'Malley, PA-C 12/12/16 1745

## 2016-12-12 NOTE — ED Triage Notes (Signed)
Pt reports N&V and chills since this morning.

## 2017-03-04 IMAGING — CR DG CHEST 1V
1 series · 1 of 1 positions shown · non-contrast
Comparison: None.

CLINICAL DATA: Positive PPD.

EXAM:
CHEST 1 VIEW

[chest pa]
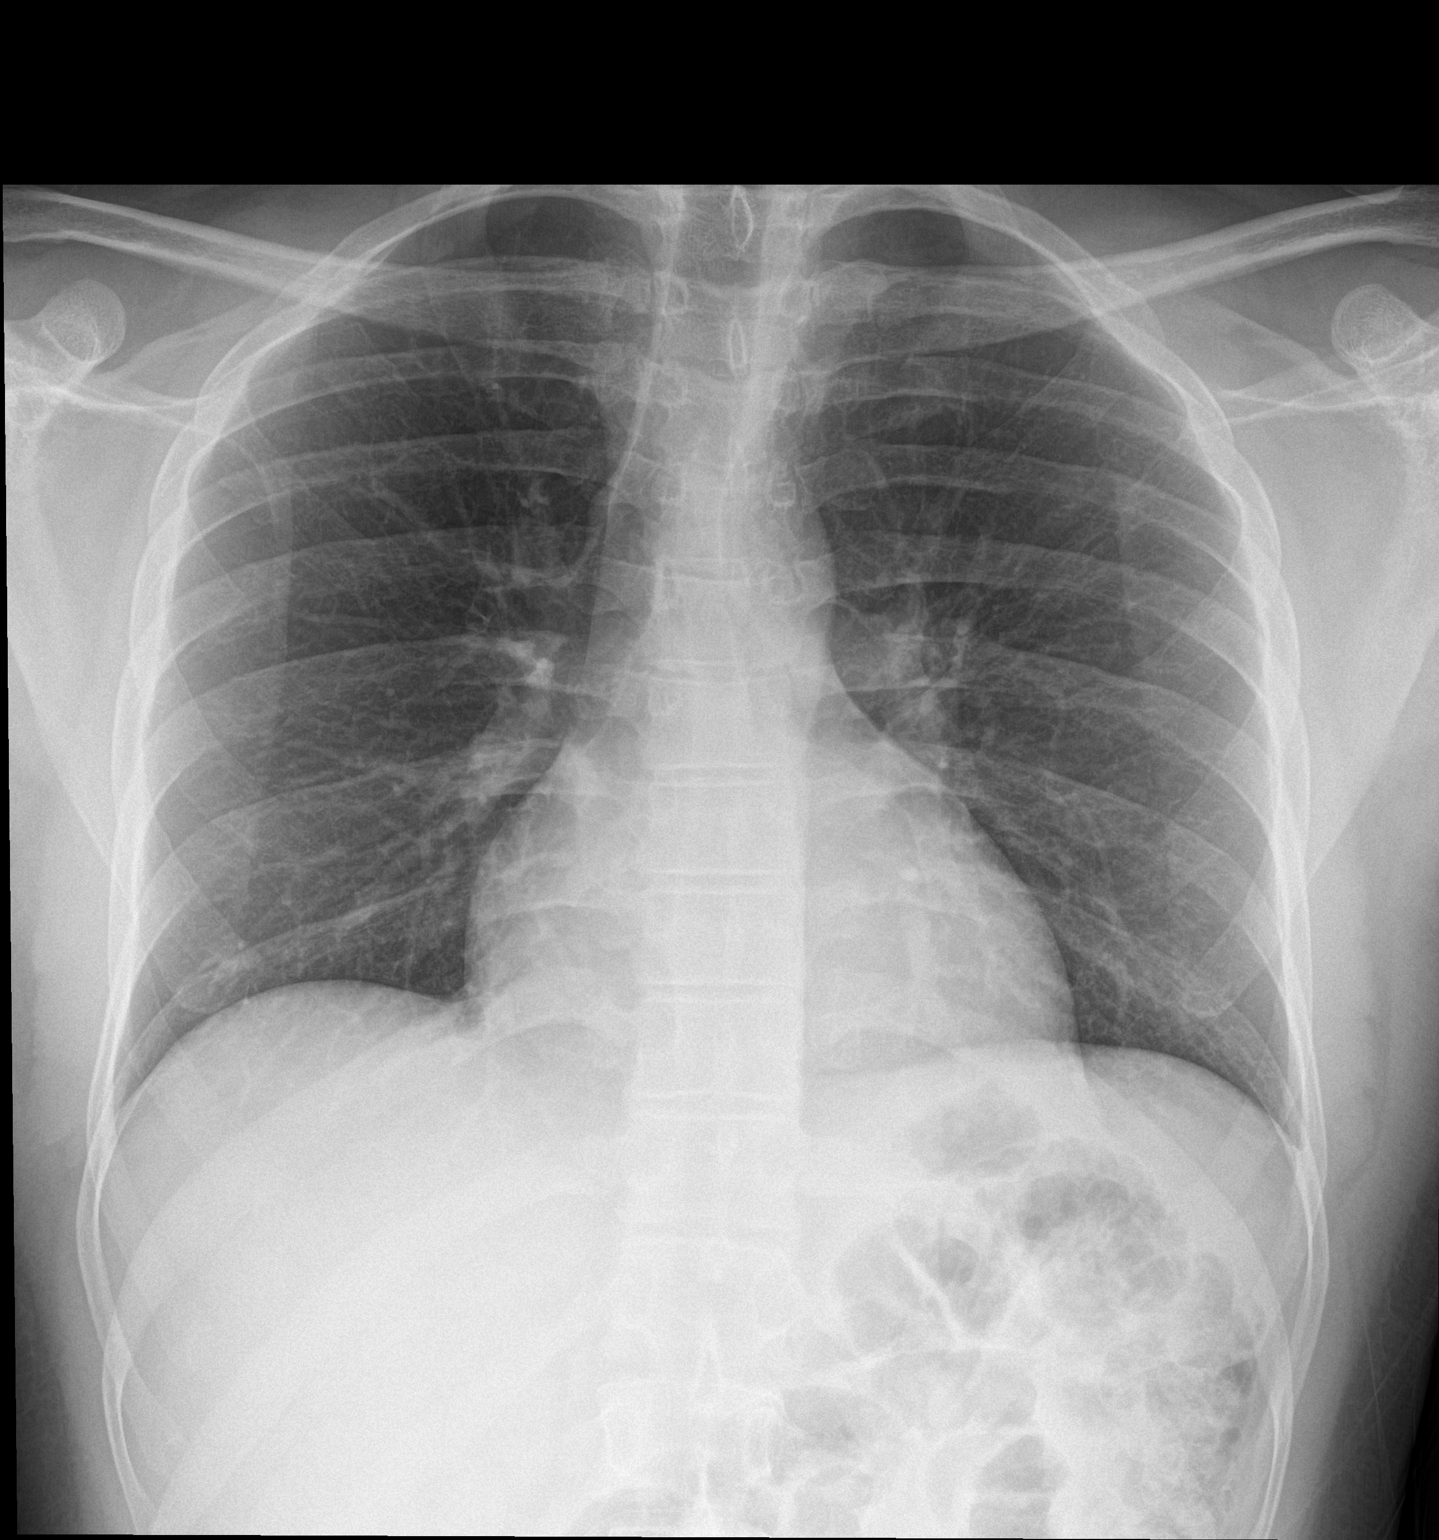

[1 of 1 positions shown; findings below may reference images not displayed]

FINDINGS: The heart size and mediastinal contours are within normal limits.
Both lungs are clear. The visualized skeletal structures are
unremarkable.
IMPRESSION: No active disease.

## 2017-08-18 ENCOUNTER — Telehealth: Payer: Self-pay | Admitting: Family Medicine

## 2017-08-18 NOTE — Telephone Encounter (Unsigned)
Copied from CRM [phone]#[id number]. >> Aug 18, 2017 10:21 AM Raquel SarnaHayes, Teresa G wrote:   Mr. Jeffrey Peck wants to establish Dr. Sherie DonLada as his PCP  He states that [facility]pp[facility] CoilBr[facility] Williamson - 89yrs is a pt there and hopes with Apolinar JunesBrandon being a family member he can be accepted as a new pt   Please call Mr. Jeffrey Peck @ [phone]

## 2017-09-10 ENCOUNTER — Encounter (HOSPITAL_COMMUNITY): Payer: Self-pay | Admitting: Emergency Medicine

## 2017-09-10 ENCOUNTER — Ambulatory Visit (HOSPITAL_COMMUNITY)
Admission: EM | Admit: 2017-09-10 | Discharge: 2017-09-10 | Disposition: A | Discharge status: Home / Self Care | Payer: Self-pay | Attending: Family Medicine | Admitting: Family Medicine

## 2017-09-10 DIAGNOSIS — R05 Cough: Secondary | ICD-10-CM

## 2017-09-10 DIAGNOSIS — R059 Cough, unspecified: Secondary | ICD-10-CM

## 2017-09-10 DIAGNOSIS — H65112 Acute and subacute allergic otitis media (mucoid) (sanguinous) (serous), left ear: Secondary | ICD-10-CM

## 2017-09-10 MED ORDER — AMOXICILLIN-POT CLAVULANATE 875-125 MG PO TABS: 1.0000 | ORAL_TABLET | Freq: Two times a day (BID) | ORAL | 0 refills | Status: DC

## 2017-09-10 MED ORDER — HYDROCODONE-ACETAMINOPHEN 5-325 MG PO TABS: 1.0000 | ORAL_TABLET | Freq: Four times a day (QID) | ORAL | 0 refills | Status: DC | PRN

## 2017-09-10 NOTE — ED Triage Notes (Signed)
PT C/O: cold sx associated w/facial pressure, left ear pain and nasal congestion, HA, chills, ST  ONSET: 1 week  DENIES: fevers  TAKING MEDS: Dayquil/Nyquil, Alka Seltzer  A&O x4... NAD... Ambulatory

## 2017-09-10 NOTE — ED Provider Notes (Signed)
[facility]   [id number] 09/10/17 Arrival Time: 1532   SUBJECTIVE:  Jeffrey Peck is a 26 y.o. male who presents to the urgent care with complaint of cold sx associated w/facial pressure, left ear pain and nasal congestion, HA, chills, ST for the last week.  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 No pertinent past medical history. History reviewed. No pertinent family history. Social History   Socioeconomic History  . Marital status: Single    Spouse name: Not on file  . Number of children: Not on file  . Years of education: Not on file  . Highest education level: Not on file  Social Needs  . Financial resource strain: Not on file  . Food insecurity - worry: Not on file  . Food insecurity - inability: Not on file  . Transportation needs - medical: Not on file  . Transportation needs - non-medical: Not on file  Occupational History  . Not on file  Tobacco Use  . Smoking status: Never Smoker  . Smokeless tobacco: Never Used  Substance and Sexual Activity  . Alcohol use: Yes    Comment: occasional  . Drug use: No  . Sexual activity: Not on file  Other Topics Concern  . Not on file  Social History Narrative  . Not on file   No outpatient medications have been marked as taking for the 09/10/17 encounter [facility](Hospital Encounter).   No Known Allergies    ROS: As per HPI, remainder of ROS negative.   OBJECTIVE:   Vitals:   09/10/17 1611  BP: 134/72  Pulse: 80  Resp: 20  Temp: 97.6 F (36.4 C)  TempSrc: Oral  SpO2: 99%     General appearance: alert; no distress Eyes: PERRL; EOMI; conjunctiva normal HENT: normocephalic; atraumatic; TMs abnormal with left being red with blister, left being retracted, canal normal, external ears normal without trauma; nasal mucosa normal; oral mucosa normal; hearing loss left ear Neck: supple Lungs: clear to auscultation bilaterally Heart: regular rate and rhythm Back: no CVA tenderness Extremities: no  cyanosis or edema; symmetrical with no gross deformities Skin: warm and dry Neurologic: normal gait; grossly normal Psychological: alert and cooperative; normal mood and affect      Labs:  Results for orders placed or performed during the hospital encounter of 01/24/16  CBC  Result Value Ref Range   WBC 6.6 4.0 - 10.5 K/uL   RBC 5.03 4.22 - 5.81 MIL/uL   Hemoglobin 14.2 13.0 - 17.0 g/dL   HCT 08.644.2 57.839.0 - 46.952.0 %   MCV 87.9 78.0 - 100.0 fL   MCH 28.2 26.0 - 34.0 pg   MCHC 32.1 30.0 - 36.0 g/dL   RDW 62.912.0 52.811.5 - 41.315.5 %   Platelets 281 150 - 400 K/uL  Basic metabolic panel  Result Value Ref Range   Sodium 140 135 - 145 mmol/L   Potassium 4.1 3.5 - 5.1 mmol/L   Chloride 102 101 - 111 mmol/L   CO2 26 22 - 32 mmol/L   Glucose, Bld 121 (H) 65 - 99 mg/dL   BUN 7 6 - 20 mg/dL   Creatinine, Ser 2.441.01 0.61 - 1.24 mg/dL   Calcium 9.8 8.9 - 01.010.3 mg/dL   GFR calc non Af Amer >60 >60 mL/min   GFR calc Af Amer >60 >60 mL/min   Anion gap 12 5 - 15  Lipase, blood  Result Value Ref Range   Lipase 22 11 - 51 U/L    Labs  Reviewed - No data to display  No results found.     ASSESSMENT & PLAN:  1. Acute allergic mucoid otitis media of left ear   2. Cough     Meds ordered this encounter  Medications  . amoxicillin-clavulanate (AUGMENTIN) 875-125 MG tablet    Sig: Take 1 tablet by mouth every 12 (twelve) hours.    Dispense:  14 tablet    Refill:  0  . HYDROcodone-acetaminophen (NORCO) 5-325 MG tablet    Sig: Take 1 tablet by mouth every 6 (six) hours as needed for moderate pain.    Dispense:  12 tablet    Refill:  0    Reviewed expectations re: course of current medical issues. Questions answered. Outlined signs and symptoms indicating need for more acute intervention. Patient verbalized understanding. After Visit Summary given.    Procedures:      Elvina SidleLauenstein, Ralene Gasparyan, MD 09/10/17 1645

## 2017-09-10 NOTE — Discharge Instructions (Signed)
Use Afrin (oxymetazoline) nasal spray to left nostril each night for the next three nights to relieve the facial pressure

## 2018-05-09 ENCOUNTER — Encounter: Payer: Self-pay | Admitting: Emergency Medicine

## 2018-05-09 ENCOUNTER — Emergency Department
Admission: EM | Admit: 2018-05-09 | Discharge: 2018-05-09 | Disposition: A | Discharge status: Home / Self Care | Payer: Self-pay | Attending: Emergency Medicine | Admitting: Emergency Medicine

## 2018-05-09 DIAGNOSIS — Z79899 Other long term (current) drug therapy: Secondary | ICD-10-CM | POA: Insufficient documentation

## 2018-05-09 DIAGNOSIS — R300 Dysuria: Secondary | ICD-10-CM | POA: Insufficient documentation

## 2018-05-09 DIAGNOSIS — R369 Urethral discharge, unspecified: Secondary | ICD-10-CM | POA: Insufficient documentation

## 2018-05-09 DIAGNOSIS — Z202 Contact with and (suspected) exposure to infections with a predominantly sexual mode of transmission: Secondary | ICD-10-CM | POA: Insufficient documentation

## 2018-05-09 LAB — URINALYSIS, COMPLETE (UACMP) WITH MICROSCOPIC
Bilirubin Urine: NEGATIVE
GLUCOSE, UA: NEGATIVE mg/dL
Hgb urine dipstick: NEGATIVE
Ketones, ur: NEGATIVE mg/dL
Nitrite: NEGATIVE
PH: 6 (ref 5.0–8.0)
PROTEIN: NEGATIVE mg/dL
SQUAMOUS EPITHELIAL / LPF: NONE SEEN (ref 0–5)
Specific Gravity, Urine: 1.012 (ref 1.005–1.030)
WBC, UA: >50 WBC/hpf — ABNORMAL HIGH (ref 0–5)

## 2018-05-09 MED ORDER — CEFTRIAXONE SODIUM 250 MG IJ SOLR
250.0000 mg | Freq: Once | INTRAMUSCULAR | Status: AC
  Administered 2018-05-09: 250 mg via INTRAMUSCULAR
  Filled 2018-05-09: qty 250

## 2018-05-09 MED ORDER — LIDOCAINE HCL (PF) 1 % IJ SOLN: 0.9000 mL | Freq: Once | INTRAMUSCULAR | Status: DC

## 2018-05-09 MED ORDER — AZITHROMYCIN 500 MG PO TABS
1000.0000 mg | ORAL_TABLET | Freq: Once | ORAL | Status: AC
  Administered 2018-05-09: 1000 mg via ORAL
  Filled 2018-05-09: qty 2

## 2018-05-09 NOTE — ED Triage Notes (Signed)
Pt c/o penile discharge and hematuria x1 day. Pt has had sexual contact with new partner, unknown of previous/past STD.

## 2018-05-09 NOTE — ED Provider Notes (Signed)
[facility] Emergency Department Provider Note  ____________________________________________  Time seen: Approximately 10:11 PM  I have reviewed the triage vital signs and the nursing notes.   HISTORY  Chief Complaint Penile Discharge    HPI Jeffrey Peck is a 27 y.o. male who presents the emergency department complaining of dysuria, penile discharge.  Symptoms began today.  Patient reports that he may have contact with an STD having had unprotected sex.  Patient denies any abdominal pain, suprapubic pain, flank pain.  No direct hematuria, but states that he has had some blood in the penile drainage.  No other complaints at this time.  No medications for this complaint prior to arrival.  Symptoms began several hours prior to arrival in the emergency department.  Patient has no history of STD/STI.  No history of UTI or kidney stones.  he denies any penile lesions, painless or otherwise.    History reviewed. No pertinent past medical history.  There are no active problems to display for this patient.   History reviewed. No pertinent surgical history.  Prior to Admission medications   Medication Sig Start Date End Date Taking? Authorizing Provider  amoxicillin-clavulanate (AUGMENTIN) 875-125 MG tablet Take 1 tablet by mouth every 12 (twelve) hours. 09/10/17   Elvina SidleLauenstein, Kurt, MD  HYDROcodone-acetaminophen (NORCO) 5-325 MG tablet Take 1 tablet by mouth every 6 (six) hours as needed for moderate pain. 09/10/17   Elvina SidleLauenstein, Kurt, MD  oseltamivir (TAMIFLU) 75 MG capsule Take 1 capsule (75 mg total) by mouth every 12 (twelve) hours. 11/01/16   Deatra Canterxford, William J, FNP    Allergies Patient has no known allergies.  History reviewed. No pertinent family history.  Social History Social History   Tobacco Use  . Smoking status: Never Smoker  . Smokeless tobacco: Never Used  Substance Use Topics  . Alcohol use: Yes    Comment: occasional  . Drug use: No      Review of Systems  Constitutional: No fever/chills Eyes: No visual changes.  Cardiovascular: no chest pain. Respiratory: no cough. No SOB. Gastrointestinal: No abdominal pain.  No nausea, no vomiting.  No diarrhea.  No constipation. Genitourinary: Positive for dysuria. No urinary hematuria.  Positive for penile discharge with blood-tinged discharge. Musculoskeletal: Negative for musculoskeletal pain. Skin: Negative for rash, abrasions, lacerations, ecchymosis. Neurological: Negative for headaches, focal weakness or numbness. 10-point ROS otherwise negative.  ____________________________________________   PHYSICAL EXAM:  VITAL SIGNS: ED Triage Vitals [05/09/18 2209]  Enc Vitals Group     BP (!) 150/84     Pulse Rate 84     Resp 17     Temp 98 F (36.7 C)     Temp Source Oral     SpO2 98 %     Weight 190 lb 0.6 oz (86.2 kg)     Height      Head Circumference      Peak Flow      Pain Score      Pain Loc      Pain Edu?      Excl. in [place]?      Constitutional: Alert and oriented. Well appearing and in no acute distress. Eyes: Conjunctivae are normal. PERRL. EOMI. Head: Atraumatic. ENT:      Ears:       Nose: No congestion/rhinnorhea.      Mouth/Throat: Mucous membranes are moist.  Neck: No stridor.    Cardiovascular: Normal rate, regular rhythm. Normal S1 and S2.  Good peripheral circulation. Respiratory: Normal  respiratory effort without tachypnea or retractions. Lungs CTAB. Good air entry to the bases with no decreased or absent breath sounds. Gastrointestinal: Bowel sounds 4 quadrants. Soft and nontender to palpation. No guarding or rigidity. No palpable masses. No distention. No CVA tenderness. Genitourinary: No visible lesions or chancres.  No drainage noted. Musculoskeletal: Full range of motion to all extremities. No gross deformities appreciated. Neurologic:  Normal speech and language. No gross focal neurologic deficits are appreciated.  Skin:  Skin is  warm, dry and intact. No rash noted. Psychiatric: Mood and affect are normal. Speech and behavior are normal. Patient exhibits appropriate insight and judgement.   ____________________________________________   LABS (all labs ordered are listed, but only abnormal results are displayed)  Labs Reviewed  CHLAMYDIA/NGC RT PCR ([facility] ONLY)  URINALYSIS, COMPLETE (U[facility]MP) WITH MICROSCOPIC   ____________________________________________  EKG   ____________________________________________  RADIOLOGY   No results found.  ____________________________________________    PROCEDURES  Procedure(s) performed:    Procedures    Medications  cefTRIAXone (ROCEPHIN) injection 250 mg (has no administration in time range)  azithromycin (ZITHROMAX) tablet 1,000 mg (1,000 mg Oral Given 05/09/18 2225)     ____________________________________________   INITIAL IMPRESSION / ASSESSMENT AND PLAN / ED COURSE  Pertinent labs & imaging results that were available during my care of the patient were reviewed by me and considered in my medical decision making (see chart for details).  Review of the [place] CSRS was performed in accordance of the [facility] prior to dispensing any controlled drugs.      Patient's diagnosis is consistent with dysuria, contact with STD.  Presents with several hours history of dysuria, penile discharge.  Differential includes kidney stone, UTI, STD.  Patient has no symptoms consistent with nephrolithiasis.  Most likely diagnosis is gonorrhea or chlamydia.  Patient will be treated empirically with Rocephin and Zithromax.  If patient is results return positive he will follow-up in 3 weeks at health department for repeat testing.  Otherwise, if symptoms worsen or change he may return to emergency department or follow-up with primary care. Patient is given ED precautions to return to the ED for any worsening or new  symptoms.     ____________________________________________  FINAL CLINICAL IMPRESSION(S) / ED DIAGNOSES  Final diagnoses:  Dysuria  Possible exposure to STD      NEW MEDICATIONS STARTED DURING THIS VISIT:  ED Discharge Orders    None          This chart was dictated using voice recognition software/Dragon. Despite best efforts to proofread, errors can occur which can change the meaning. Any change was purely unintentional.    Racheal PatchesCuthriell, Robbi Scurlock D, PA-C 05/09/18 2226    Nita SickleVeronese, Thornton, MD 05/12/18 [phone]

## 2018-05-10 LAB — CHLAMYDIA/NGC RT PCR (ARMC ONLY)
Chlamydia Tr: NOT DETECTED
N GONORRHOEAE: DETECTED — AB

## 2018-05-11 ENCOUNTER — Telehealth: Payer: Self-pay | Admitting: Emergency Medicine

## 2018-05-11 NOTE — Telephone Encounter (Addendum)
Called patient to inform of positive gonorrhea. He was treated in the ED.  Left message asking him to call me.  Patient called me back.  Test explained and told him it was recommended to follow up with [facility] in 3 weeks for repeat test.  Also, his partner has not been treated.  Explained importance of treatment and that [facility] will treat for free.  Advised to have them call now, so they can get in for treatment today.

## 2019-07-02 ENCOUNTER — Encounter (HOSPITAL_COMMUNITY): Payer: Self-pay

## 2019-07-02 ENCOUNTER — Other Ambulatory Visit: Payer: Self-pay

## 2019-07-02 ENCOUNTER — Ambulatory Visit (HOSPITAL_COMMUNITY)
Admission: EM | Admit: 2019-07-02 | Discharge: 2019-07-02 | Disposition: A | Discharge status: Home / Self Care | Payer: BC Managed Care – PPO | Attending: Emergency Medicine | Admitting: Emergency Medicine

## 2019-07-02 DIAGNOSIS — Z202 Contact with and (suspected) exposure to infections with a predominantly sexual mode of transmission: Secondary | ICD-10-CM | POA: Insufficient documentation

## 2019-07-02 DIAGNOSIS — R030 Elevated blood-pressure reading, without diagnosis of hypertension: Secondary | ICD-10-CM | POA: Diagnosis not present

## 2019-07-02 DIAGNOSIS — R369 Urethral discharge, unspecified: Secondary | ICD-10-CM | POA: Diagnosis not present

## 2019-07-02 MED ORDER — AZITHROMYCIN 250 MG PO TABS
1000.0000 mg | ORAL_TABLET | Freq: Once | ORAL | Status: AC
  Administered 2019-07-02: 1000 mg via ORAL

## 2019-07-02 MED ORDER — AZITHROMYCIN 250 MG PO TABS
ORAL_TABLET | ORAL | Status: AC
  Filled 2019-07-02: qty 4

## 2019-07-02 MED ORDER — CEFTRIAXONE SODIUM 250 MG IJ SOLR
INTRAMUSCULAR | Status: AC
  Filled 2019-07-02: qty 250

## 2019-07-02 MED ORDER — LIDOCAINE HCL (PF) 1 % IJ SOLN
INTRAMUSCULAR | Status: AC
  Filled 2019-07-02: qty 2

## 2019-07-02 MED ORDER — CEFTRIAXONE SODIUM 250 MG IJ SOLR
250.0000 mg | Freq: Once | INTRAMUSCULAR | Status: AC
  Administered 2019-07-02: 250 mg via INTRAMUSCULAR

## 2019-07-02 NOTE — ED Provider Notes (Addendum)
[place]    CSN: [id number] Arrival date & time: 07/02/19  1727      History   Chief Complaint Chief Complaint  Patient presents with  . SEXUALLY TRANSMITTED DISEASE    HPI Jeffrey Peck is a 28 y.o. male.   Patient presents with yellow penile discharge x2 days.  He is sexually active and does not consistently use condoms.  He denies fever, chills, lesions, rash, abdominal pain, dysuria, back pain, or other symptoms.  The history is provided by the patient.    History reviewed. No pertinent past medical history.  There are no active problems to display for this patient.   History reviewed. No pertinent surgical history.     Home Medications    Prior to Admission medications   Medication Sig Start Date End Date Taking? Authorizing Provider  amoxicillin-clavulanate (AUGMENTIN) 875-125 MG tablet Take 1 tablet by mouth every 12 (twelve) hours. Patient not taking: Reported on 07/02/2019 09/10/17   Robyn Haber, MD  HYDROcodone-acetaminophen (NORCO) 5-325 MG tablet Take 1 tablet by mouth every 6 (six) hours as needed for moderate pain. Patient not taking: Reported on 07/02/2019 09/10/17   Robyn Haber, MD  oseltamivir (TAMIFLU) 75 MG capsule Take 1 capsule (75 mg total) by mouth every 12 (twelve) hours. Patient not taking: Reported on 07/02/2019 11/01/16   Lysbeth Penner, FNP    Family History History reviewed. No pertinent family history.  Social History Social History   Tobacco Use  . Smoking status: Never Smoker  . Smokeless tobacco: Never Used  Substance Use Topics  . Alcohol use: Yes    Comment: occasional  . Drug use: No     Allergies   Patient has no known allergies.   Review of Systems Review of Systems  Constitutional: Negative for chills and fever.  HENT: Negative for ear pain and sore throat.   Eyes: Negative for pain and visual disturbance.  Respiratory: Negative for cough and shortness of breath.   Cardiovascular:  Negative for chest pain and palpitations.  Gastrointestinal: Negative for abdominal pain and vomiting.  Genitourinary: Positive for discharge. Negative for dysuria, flank pain, hematuria and testicular pain.  Musculoskeletal: Negative for arthralgias and back pain.  Skin: Negative for color change and rash.  Neurological: Negative for seizures and syncope.  All other systems reviewed and are negative.    Physical Exam Triage Vital Signs ED Triage Vitals  Enc Vitals Group     BP 07/02/19 1740 (!) 151/99     Pulse Rate 07/02/19 1740 79     Resp 07/02/19 1740 18     Temp 07/02/19 1740 98.8 F (37.1 C)     Temp Source 07/02/19 1740 Oral     SpO2 07/02/19 1740 100 %     Weight 07/02/19 1738 190 lb (86.2 kg)     Height --      Head Circumference --      Peak Flow --      Pain Score 07/02/19 1738 6     Pain Loc --      Pain Edu? --      Excl. in [place]? --    No data found.  Updated Vital Signs BP (!) 151/99 (BP Location: Right Arm)   Pulse 79   Temp 98.8 F (37.1 C) (Oral)   Resp 18   Wt 190 lb (86.2 kg)   SpO2 100%   BMI 25.77 kg/m   Visual Acuity Right Eye Distance:   Left Eye  Distance:   Bilateral Distance:    Right Eye Near:   Left Eye Near:    Bilateral Near:     Physical Exam Vitals signs and nursing note reviewed.  Constitutional:      Appearance: He is well-developed.  HENT:     Head: Normocephalic and atraumatic.  Eyes:     Conjunctiva/sclera: Conjunctivae normal.  Neck:     Musculoskeletal: Neck supple.  Cardiovascular:     Rate and Rhythm: Normal rate and regular rhythm.     Heart sounds: No murmur.  Pulmonary:     Effort: Pulmonary effort is normal. No respiratory distress.     Breath sounds: Normal breath sounds.  Abdominal:     General: Bowel sounds are normal.     Palpations: Abdomen is soft.     Tenderness: There is no abdominal tenderness. There is no right CVA tenderness, left CVA tenderness, guarding or rebound.  Genitourinary:     Scrotum/Testes: Normal.     Comments: Yellow penile drainage.  Skin:    General: Skin is warm and dry.  Neurological:     Mental Status: He is alert.      UC Treatments / Results  Labs (all labs ordered are listed, but only abnormal results are displayed) Labs Reviewed  CYTOLOGY, (ORAL, ANAL, URETHRAL) ANCILLARY ONLY    EKG   Radiology No results found.  Procedures Procedures (including critical care time)  Medications Ordered in UC Medications  cefTRIAXone (ROCEPHIN) injection 250 mg (has no administration in time range)  azithromycin (ZITHROMAX) tablet 1,000 mg (has no administration in time range)    Initial Impression / Assessment and Plan / UC Course  I have reviewed the triage vital signs and the nursing notes.  Pertinent labs & imaging results that were available during my care of the patient were reviewed by me and considered in my medical decision making (see chart for details).   Penile discharge, possible exposure to STD.  Treating with Rocephin and Zithromax.  Urethral swab for STDs obtained.  Instructed patient not to have sex for 7 days.  Discussed that we will call him with his test results if they are positive and that he and his partners may need additional treatment at that time.  Discussed with patient that his blood pressure is elevated today and that he needs to have this rechecked by his PCP or the suggested PCP in 2 to 4 weeks.  Patient agrees to plan of care.  Final Clinical Impressions(s) / UC Diagnoses   Final diagnoses:  Penile discharge  Possible exposure to STD     Discharge Instructions     You were treated with two antibiotics today, Rocephin and Zithromax.     Do not have sex for 7 days.  Your STD tests are pending.  If your test results are positive, we will call you.  You may need additional treatment and your partner may also need treatment.         ED Prescriptions    None     PDMP not reviewed this encounter.   Mickie Bail, NP 07/02/19 1815    Mickie Bail, NP 07/02/19 1816

## 2019-07-02 NOTE — Discharge Instructions (Addendum)
You were treated with two antibiotics today, Rocephin and Zithromax.     Do not have sex for 7 days.  Your STD tests are pending.  If your test results are positive, we will call you.  You may need additional treatment and your partner may also need treatment.      Your blood pressure is elevated today at 151/99.  Please have this rechecked by your primary care provider in 2 weeks.  If you do not have a primary care provider, one is suggested below.

## 2019-07-02 NOTE — ED Triage Notes (Signed)
Pt states he is having a penile discharge x 2 days.

## 2019-07-04 ENCOUNTER — Telehealth (HOSPITAL_COMMUNITY): Payer: Self-pay | Admitting: Emergency Medicine

## 2019-07-04 LAB — CYTOLOGY, (ORAL, ANAL, URETHRAL) ANCILLARY ONLY
Chlamydia: NEGATIVE
Neisseria Gonorrhea: POSITIVE — AB
Trichomonas: NEGATIVE

## 2019-07-04 NOTE — Telephone Encounter (Signed)
Test for gonorrhea was positive. This was treated at the urgent care visit with IM rocephin 250mg and po zithromax 1g. Pt needs education to refrain from sexual intercourse for 7 days after treatment to give the medicine time to work. Sexual partners need to be notified and tested/treated. Condoms may reduce risk of reinfection. Recheck or followup with PCP for further evaluation if symptoms are not improving. [facility] notified.   Attempted to reach patient. No answer at this time. Voicemail left.     

## 2019-07-08 ENCOUNTER — Telehealth (HOSPITAL_COMMUNITY): Payer: Self-pay | Admitting: Emergency Medicine

## 2019-07-08 NOTE — Telephone Encounter (Signed)
Patient contacted and made aware of  gonorrhea  results, all questions answered   

## 2019-10-06 ENCOUNTER — Encounter (HOSPITAL_COMMUNITY): Payer: Self-pay

## 2019-10-06 ENCOUNTER — Emergency Department (HOSPITAL_COMMUNITY)
Admission: EM | Admit: 2019-10-06 | Discharge: 2019-10-06 | Disposition: A | Discharge status: Home / Self Care | Payer: BC Managed Care – PPO | Attending: Emergency Medicine | Admitting: Emergency Medicine

## 2019-10-06 DIAGNOSIS — R1110 Vomiting, unspecified: Secondary | ICD-10-CM | POA: Insufficient documentation

## 2019-10-06 DIAGNOSIS — Z5321 Procedure and treatment not carried out due to patient leaving prior to being seen by health care provider: Secondary | ICD-10-CM | POA: Insufficient documentation

## 2019-10-06 DIAGNOSIS — R1084 Generalized abdominal pain: Secondary | ICD-10-CM | POA: Diagnosis not present

## 2019-10-06 LAB — COMPREHENSIVE METABOLIC PANEL WITH GFR
ALT: 45 U/L — ABNORMAL HIGH (ref 0–44)
AST: 29 U/L (ref 15–41)
Albumin: 4.7 g/dL (ref 3.5–5.0)
Alkaline Phosphatase: 75 U/L (ref 38–126)
Anion gap: 10 (ref 5–15)
BUN: 14 mg/dL (ref 6–20)
CO2: 26 mmol/L (ref 22–32)
Calcium: 9.6 mg/dL (ref 8.9–10.3)
Chloride: 103 mmol/L (ref 98–111)
Creatinine, Ser: 1.08 mg/dL (ref 0.61–1.24)
GFR calc Af Amer: >60 mL/min
GFR calc non Af Amer: >60 mL/min
Glucose, Bld: 103 mg/dL — ABNORMAL HIGH (ref 70–99)
Potassium: 3.6 mmol/L (ref 3.5–5.1)
Sodium: 139 mmol/L (ref 135–145)
Total Bilirubin: 0.7 mg/dL (ref 0.3–1.2)
Total Protein: 8 g/dL (ref 6.5–8.1)

## 2019-10-06 LAB — CBC
HCT: 45.3 % (ref 39.0–52.0)
Hemoglobin: 15.1 g/dL (ref 13.0–17.0)
MCH: 29.5 pg (ref 26.0–34.0)
MCHC: 33.3 g/dL (ref 30.0–36.0)
MCV: 88.6 fL (ref 80.0–100.0)
Platelets: 317 10*3/uL (ref 150–400)
RBC: 5.11 MIL/uL (ref 4.22–5.81)
RDW: 11.9 % (ref 11.5–15.5)
WBC: 7.3 10*3/uL (ref 4.0–10.5)
nRBC: 0 % (ref 0.0–0.2)

## 2019-10-06 LAB — LIPASE, BLOOD: Lipase: 21 U/L (ref 11–51)

## 2019-10-06 MED ORDER — SODIUM CHLORIDE 0.9% FLUSH: 3.0000 mL | Freq: Once | INTRAVENOUS | Status: DC

## 2019-10-06 NOTE — ED Triage Notes (Signed)
Pt reports generalized abdominal pain and vomiting that started about 1 hour ago. He states that he saw blood in it which prompted him to come here. Denies diarrhea.

## 2020-02-25 ENCOUNTER — Emergency Department (HOSPITAL_COMMUNITY)
Admission: EM | Admit: 2020-02-25 | Discharge: 2020-02-25 | Disposition: A | Discharge status: Home / Self Care | Payer: BC Managed Care – PPO | Attending: Emergency Medicine | Admitting: Emergency Medicine

## 2020-02-25 ENCOUNTER — Other Ambulatory Visit: Payer: Self-pay

## 2020-02-25 ENCOUNTER — Encounter (HOSPITAL_COMMUNITY): Payer: Self-pay

## 2020-02-25 DIAGNOSIS — J029 Acute pharyngitis, unspecified: Secondary | ICD-10-CM | POA: Insufficient documentation

## 2020-02-25 LAB — GROUP A STREP BY PCR: Group A Strep by PCR: NOT DETECTED

## 2020-02-25 MED ORDER — AZITHROMYCIN 250 MG PO TABS: 250.0000 mg | ORAL_TABLET | Freq: Every day | ORAL | 0 refills | Status: DC

## 2020-02-25 MED ORDER — ACETAMINOPHEN 325 MG PO TABS
650.0000 mg | ORAL_TABLET | Freq: Once | ORAL | Status: AC
  Administered 2020-02-25: 650 mg via ORAL
  Filled 2020-02-25: qty 2

## 2020-02-25 NOTE — ED Notes (Signed)
Pt requesting pain medication.  

## 2020-02-25 NOTE — ED Triage Notes (Signed)
Pt reports that he has has a sore throat since last night and it hurts to swallow, denies fevers.

## 2020-02-25 NOTE — ED Notes (Signed)
Patient verbalizes understanding of discharge instructions. Opportunity for questioning and answers were provided. Pt discharged from ED. 

## 2020-02-25 NOTE — Discharge Instructions (Signed)
Please return for any problem.  Follow-up with a regular care provider as instructed. 

## 2020-02-25 NOTE — ED Provider Notes (Signed)
[facility] DEPART[facility] Provider Note   CSN: [id number] Arrival date & time: 02/25/20  0406     History Chief Complaint  Patient presents with  . Sore Throat    Jeffrey Peck is a 29 y.o. male.  29 year old male with prior medical history as detailed below presents for evaluation of sore throat.  Patient reports onset of sore throat last night.  He did not take anything at home for symptoms.  He denies fever.  He denies known sick contacts.  He is reporting increased pain with talking and with swallowing.  He is not reporting any respiratory distress.  He is requesting a check for strep throat and a work note.  The history is provided by the patient and medical records.  Sore Throat This is a new problem. The current episode started yesterday. The problem occurs constantly. The problem has not changed since onset.Pertinent negatives include no chest pain, no abdominal pain, no headaches and no shortness of breath. Nothing aggravates the symptoms. Nothing relieves the symptoms.       History reviewed. No pertinent past medical history.  There are no problems to display for this patient.   History reviewed. No pertinent surgical history.     No family history on file.  Social History   Tobacco Use  . Smoking status: Never Smoker  . Smokeless tobacco: Never Used  Substance Use Topics  . Alcohol use: Yes    Comment: occasional  . Drug use: No    Home Medications Prior to Admission medications   Medication Sig Start Date End Date Taking? Authorizing Provider  amoxicillin-clavulanate (AUGMENTIN) 875-125 MG tablet Take 1 tablet by mouth every 12 (twelve) hours. Patient not taking: Reported on 07/02/2019 09/10/17   Robyn Haber, MD  HYDROcodone-acetaminophen (NORCO) 5-325 MG tablet Take 1 tablet by mouth every 6 (six) hours as needed for moderate pain. Patient not taking: Reported on 07/02/2019 09/10/17   Robyn Haber, MD  oseltamivir  (TAMIFLU) 75 MG capsule Take 1 capsule (75 mg total) by mouth every 12 (twelve) hours. Patient not taking: Reported on 07/02/2019 11/01/16   Lysbeth Penner, FNP    Allergies    Patient has no known allergies.  Review of Systems   Review of Systems  Respiratory: Negative for shortness of breath.   Cardiovascular: Negative for chest pain.  Gastrointestinal: Negative for abdominal pain.  Neurological: Negative for headaches.  All other systems reviewed and are negative.   Physical Exam Updated Vital Signs BP (!) 150/110 (BP Location: Right Arm)   Pulse 79   Temp 98.1 F (36.7 C) (Oral)   Resp 16   SpO2 100%   Physical Exam Vitals and nursing note reviewed.  Constitutional:      General: He is not in acute distress.    Appearance: He is well-developed.  HENT:     Head: Normocephalic and atraumatic.     Mouth/Throat:     Mouth: Mucous membranes are moist. No oral lesions.     Pharynx: Uvula midline. Posterior oropharyngeal erythema present. No oropharyngeal exudate.     Comments: Mild erythema noted posteriorly on the pharynx.  Uvula midline.  No exudates.  No evidence of PTA. Eyes:     Conjunctiva/sclera: Conjunctivae normal.     Pupils: Pupils are equal, round, and reactive to light.  Cardiovascular:     Rate and Rhythm: Normal rate and regular rhythm.     Heart sounds: Normal heart sounds.  Pulmonary:  Effort: Pulmonary effort is normal. No respiratory distress.     Breath sounds: Normal breath sounds.  Abdominal:     General: There is no distension.     Palpations: Abdomen is soft.     Tenderness: There is no abdominal tenderness.  Musculoskeletal:        General: No deformity. Normal range of motion.     Cervical back: Normal range of motion and neck supple.  Skin:    General: Skin is warm and dry.  Neurological:     Mental Status: He is alert and oriented to person, place, and time.     ED Results / Procedures / Treatments   Labs (all labs  ordered are listed, but only abnormal results are displayed) Labs Reviewed  GROUP A STREP BY PCR    EKG None  Radiology No results found.  Procedures Procedures (including critical care time)  Medications Ordered in ED Medications  acetaminophen (TYLENOL) tablet 650 mg (650 mg Oral Given 02/25/20 0703)    ED Course  I have reviewed the triage vital signs and the nursing notes.  Pertinent labs & imaging results that were available during my care of the patient were reviewed by me and considered in my medical decision making (see chart for details).    MDM Rules/Calculators/A&P                       MDM  Screen complete  Jeffrey Peck was evaluated in Emergency Department on 02/25/2020 for the symptoms described in the history of present illness. He was evaluated in the context of the global COVID-19 pandemic, which necessitated consideration that the patient might be at risk for infection with the SARS-CoV-2 virus that causes COVID-19. Institutional protocols and algorithms that pertain to the evaluation of patients at risk for COVID-19 are in a state of rapid change based on information released by regulatory bodies including the CDC and federal and state organizations. These policies and algorithms were followed during the patient's care in the ED.   Patient is presenting for evaluation of sore throat.  Likely viral etiology.  No evidence of PTA found on exam  However, out of an abundance of caution out of patient's request we will provide with a prescription for Z-Pak.  Patient is advised to allow his symptoms to resolve on their own within the next 48 hours.  If he feels worse or if his symptoms fail to improve he can always take the Z-Pak at that time.  Importance of close follow-up is stressed.  Strict return precautions given and understood.   Final Clinical Impression(s) / ED Diagnoses Final diagnoses:  Pharyngitis, unspecified etiology    Rx / DC Orders ED  Discharge Orders         Ordered    azithromycin (ZITHROMAX) 250 MG tablet  Daily     02/25/20 0805           Wynetta Fines, MD 02/25/20 [id number]

## 2020-10-08 ENCOUNTER — Other Ambulatory Visit: Payer: BC Managed Care – PPO

## 2021-01-27 ENCOUNTER — Other Ambulatory Visit: Payer: Self-pay

## 2021-01-27 ENCOUNTER — Ambulatory Visit
Admission: EM | Admit: 2021-01-27 | Discharge: 2021-01-27 | Disposition: A | Discharge status: Home / Self Care | Payer: BC Managed Care – PPO | Attending: Family Medicine | Admitting: Family Medicine

## 2021-01-27 DIAGNOSIS — J0100 Acute maxillary sinusitis, unspecified: Secondary | ICD-10-CM | POA: Diagnosis not present

## 2021-01-27 DIAGNOSIS — J209 Acute bronchitis, unspecified: Secondary | ICD-10-CM

## 2021-01-27 DIAGNOSIS — Z20822 Contact with and (suspected) exposure to covid-19: Secondary | ICD-10-CM

## 2021-01-27 MED ORDER — PROMETHAZINE-DM 6.25-15 MG/5ML PO SYRP: 5.0000 mL | ORAL_SOLUTION | Freq: Four times a day (QID) | ORAL | 0 refills | Status: DC | PRN

## 2021-01-27 MED ORDER — PREDNISONE 20 MG PO TABS: 40.0000 mg | ORAL_TABLET | Freq: Every day | ORAL | 0 refills | Status: DC

## 2021-01-27 MED ORDER — AMOXICILLIN 875 MG PO TABS: 875.0000 mg | ORAL_TABLET | Freq: Two times a day (BID) | ORAL | 0 refills | Status: DC

## 2021-01-27 NOTE — ED Provider Notes (Signed)
[facility]    CSN: [id number] Arrival date & time: 01/27/21  1125      History   Chief Complaint Chief Complaint  Patient presents with  . Cough    HPI Jeffrey Peck is a 30 y.o. male.   HPI  Patient presents with URI symptoms including cough, sore throat, nasal congestion and  sinus pressure. No known COVID exposure.  Patient had similar symptoms over a week ago which she took over-the-counter medication which alleviated symptoms.  He denies any shortness of breath or weakness or any history of asthma or allergies.  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   History reviewed. No pertinent past medical history.  There are no problems to display for this patient.   History reviewed. No pertinent surgical history.     Home Medications    Prior to Admission medications   Not on File    Family History History reviewed. No pertinent family history.  Social History Social History   Tobacco Use  . Smoking status: Never Smoker  . Smokeless tobacco: Never Used  Substance Use Topics  . Alcohol use: Yes    Comment: occasional  . Drug use: No     Allergies   Patient has no known allergies.   Review of Systems Review of Systems Pertinent negatives listed in HPI   Physical Exam Triage Vital Signs ED Triage Vitals  Enc Vitals Group     BP 01/27/21 1212 (!) 144/108     Pulse Rate 01/27/21 1212 96     Resp 01/27/21 1212 18     Temp 01/27/21 1212 98.5 F (36.9 C)     Temp Source 01/27/21 1212 Oral     SpO2 01/27/21 1212 97 %     Weight --      Height --      Head Circumference --      Peak Flow --      Pain Score 01/27/21 1213 8     Pain Loc --      Pain Edu? --      Excl. in [place]? --    No data found.  Updated Vital Signs BP (!) 144/108 (BP Location: Left Arm)   Pulse 96   Temp 98.5 F (36.9 C) (Oral)   Resp 18   SpO2 97%   Visual Acuity Right Eye Distance:   Left Eye Distance:   Bilateral Distance:    Right  Eye Near:   Left Eye Near:    Bilateral Near:     Physical Exam   General Appearance:    Alert, cooperative, no distress  HENT:   Normocephalic, ears normal, nares mucosal edema with congestion, rhinorrhea, oropharynx erythematous without edema or exudate  Eyes:    PERRL, conjunctiva/corneas clear, EOM's intact       Lungs:     Clear to auscultation bilaterally, respirations unlabored  Heart:    Regular rate and rhythm  Neurologic:   Awake, alert, oriented x 3. No apparent focal neurological           defect.     UC Treatments / Results  Labs (all labs ordered are listed, but only abnormal results are displayed) Labs Reviewed  COVID-19, FLU A+B NAA    EKG   Radiology No results found.  Procedures Procedures (including critical care time)  Medications Ordered in UC Medications - No data to display  Initial Impression / Assessment and Plan / UC Course  I have  reviewed the triage vital signs and the nursing notes.  Pertinent labs & imaging results that were available during my care of the patient were reviewed by me and considered in my medical decision making (see chart for details).    COVID/flu pending given patient's symptoms and overall physical exam I am treating him today for acute bronchitis and sinusitis.  Treatment per discharge medication orders.  Patient also given a note to remain out of work until results are known.  Patient was sent a code to activate the MyChart to be able to access the results sooner as negative results are not called from our office. Final Clinical Impressions(s) / UC Diagnoses   Final diagnoses:  Encounter for screening laboratory testing for COVID-19 virus  Acute bronchitis, unspecified organism  Acute non-recurrent maxillary sinusitis     Discharge Instructions     Your COVID/ Flu results should result within 48-72 at the most. Negative results are immediately resulted to Mychart. Positive results will receive a follow-up call  from our clinic. If symptoms are present, I recommend home quarantine until results are known.  Alternate Tylenol and ibuprofen as needed for body aches and fever.  Symptom management per recommendations discussed today.  If any breathing difficulty or chest pain develops go immediately to the closest emergency department for evaluation.     ED Prescriptions    Medication Sig Dispense Auth. Provider   amoxicillin (AMOXIL) 875 MG tablet Take 1 tablet (875 mg total) by mouth 2 (two) times daily. 20 tablet Bing Neighbors, FNP   promethazine-dextromethorphan (PROMETHAZINE-DM) 6.25-15 MG/5ML syrup Take 5 mLs by mouth 4 (four) times daily as needed for cough. 180 mL Bing Neighbors, FNP   predniSONE (DELTASONE) 20 MG tablet Take 2 tablets (40 mg total) by mouth daily with breakfast. 10 tablet Bing Neighbors, FNP     PDMP not reviewed this encounter.   Bing Neighbors, FNP 01/27/21 1306

## 2021-01-27 NOTE — Discharge Instructions (Addendum)
Your COVID/ Flu results should result within 48-72 at the most. Negative results are immediately resulted to Mychart. Positive results will receive a follow-up call from our clinic. If symptoms are present, I recommend home quarantine until results are known.  Alternate Tylenol and ibuprofen as needed for body aches and fever.  Symptom management per recommendations discussed today.  If any breathing difficulty or chest pain develops go immediately to the closest emergency department for evaluation.

## 2021-01-27 NOTE — ED Triage Notes (Signed)
Pt states had allergies last week and tx'd with OTC. States hasn't felt good for past few days c/o cough, congestion, chills, and headaches. States had a neg home covid test.

## 2021-01-28 LAB — COVID-19, FLU A+B NAA
Influenza A, NAA: NOT DETECTED
Influenza B, NAA: NOT DETECTED
SARS-CoV-2, NAA: DETECTED — AB

## 2021-11-16 ENCOUNTER — Emergency Department
Admission: EM | Admit: 2021-11-16 | Discharge: 2021-11-16 | Disposition: A | Discharge status: Home / Self Care | Payer: Self-pay | Attending: Emergency Medicine | Admitting: Emergency Medicine

## 2021-11-16 ENCOUNTER — Encounter: Payer: Self-pay | Admitting: Emergency Medicine

## 2021-11-16 ENCOUNTER — Other Ambulatory Visit: Payer: Self-pay

## 2021-11-16 ENCOUNTER — Ambulatory Visit: Admission: EM | Admit: 2021-11-16 | Discharge: 2021-11-16 | Payer: BC Managed Care – PPO

## 2021-11-16 ENCOUNTER — Emergency Department: Payer: Self-pay

## 2021-11-16 DIAGNOSIS — N200 Calculus of kidney: Secondary | ICD-10-CM | POA: Insufficient documentation

## 2021-11-16 DIAGNOSIS — K760 Fatty (change of) liver, not elsewhere classified: Secondary | ICD-10-CM | POA: Insufficient documentation

## 2021-11-16 LAB — URINALYSIS, ROUTINE W REFLEX MICROSCOPIC
Bacteria, UA: NONE SEEN
Bilirubin Urine: NEGATIVE
Glucose, UA: 50 mg/dL — AB
Ketones, ur: NEGATIVE mg/dL
Leukocytes,Ua: NEGATIVE
Nitrite: NEGATIVE
Protein, ur: NEGATIVE mg/dL
Specific Gravity, Urine: 1.02 (ref 1.005–1.030)
Squamous Epithelial / LPF: NONE SEEN (ref 0–5)
pH: 7 (ref 5.0–8.0)

## 2021-11-16 LAB — COMPREHENSIVE METABOLIC PANEL
ALT: 32 U/L (ref 0–44)
AST: 23 U/L (ref 15–41)
Albumin: 4.5 g/dL (ref 3.5–5.0)
Alkaline Phosphatase: 86 U/L (ref 38–126)
Anion gap: 5 (ref 5–15)
BUN: 16 mg/dL (ref 6–20)
CO2: 26 mmol/L (ref 22–32)
Calcium: 9.2 mg/dL (ref 8.9–10.3)
Chloride: 107 mmol/L (ref 98–111)
Creatinine, Ser: 1.42 mg/dL — ABNORMAL HIGH (ref 0.61–1.24)
GFR, Estimated: >60 mL/min (ref >60)
Glucose, Bld: 142 mg/dL — ABNORMAL HIGH (ref 70–99)
Potassium: 3.4 mmol/L — ABNORMAL LOW (ref 3.5–5.1)
Sodium: 138 mmol/L (ref 135–145)
Total Bilirubin: 0.8 mg/dL (ref 0.3–1.2)
Total Protein: 7.3 g/dL (ref 6.5–8.1)

## 2021-11-16 LAB — CBC
HCT: 42.4 % (ref 39.0–52.0)
Hemoglobin: 13.8 g/dL (ref 13.0–17.0)
MCH: 28.2 pg (ref 26.0–34.0)
MCHC: 32.5 g/dL (ref 30.0–36.0)
MCV: 86.7 fL (ref 80.0–100.0)
Platelets: 364 10*3/uL (ref 150–400)
RBC: 4.89 MIL/uL (ref 4.22–5.81)
RDW: 11.9 % (ref 11.5–15.5)
WBC: 8.4 10*3/uL (ref 4.0–10.5)
nRBC: 0 % (ref 0.0–0.2)

## 2021-11-16 LAB — LIPASE, BLOOD: Lipase: 29 U/L (ref 11–51)

## 2021-11-16 MED ORDER — ONDANSETRON HCL 4 MG/2ML IJ SOLN: 4.0000 mg | Freq: Once | INTRAMUSCULAR | Status: AC

## 2021-11-16 MED ORDER — IOHEXOL 300 MG/ML  SOLN
100.0000 mL | Freq: Once | INTRAMUSCULAR | Status: AC | PRN
  Administered 2021-11-16: 100 mL via INTRAVENOUS
  Filled 2021-11-16: qty 100

## 2021-11-16 MED ORDER — TAMSULOSIN HCL 0.4 MG PO CAPS: 0.4000 mg | ORAL_CAPSULE | Freq: Every day | ORAL | 0 refills | Status: DC

## 2021-11-16 MED ORDER — TAMSULOSIN HCL 0.4 MG PO CAPS
0.4000 mg | ORAL_CAPSULE | Freq: Once | ORAL | Status: AC
  Administered 2021-11-16: 0.4 mg via ORAL
  Filled 2021-11-16: qty 1

## 2021-11-16 MED ORDER — FENTANYL CITRATE PF 50 MCG/ML IJ SOSY
50.0000 ug | PREFILLED_SYRINGE | INTRAMUSCULAR | Status: AC | PRN
  Administered 2021-11-16: 50 ug via INTRAVENOUS
  Filled 2021-11-16: qty 1

## 2021-11-16 MED ORDER — KETOROLAC TROMETHAMINE 10 MG PO TABS: 10.0000 mg | ORAL_TABLET | Freq: Four times a day (QID) | ORAL | 0 refills | Status: DC | PRN

## 2021-11-16 MED ORDER — FENTANYL CITRATE PF 50 MCG/ML IJ SOSY
PREFILLED_SYRINGE | INTRAMUSCULAR | Status: AC
  Administered 2021-11-16: 50 ug via INTRAVENOUS
  Filled 2021-11-16: qty 1

## 2021-11-16 MED ORDER — KETOROLAC TROMETHAMINE 30 MG/ML IJ SOLN
30.0000 mg | Freq: Once | INTRAMUSCULAR | Status: AC
  Administered 2021-11-16: 30 mg via INTRAVENOUS
  Filled 2021-11-16: qty 1

## 2021-11-16 MED ORDER — ONDANSETRON HCL 4 MG/2ML IJ SOLN
4.0000 mg | Freq: Once | INTRAMUSCULAR | Status: DC
  Filled 2021-11-16: qty 2

## 2021-11-16 MED ORDER — ONDANSETRON HCL 4 MG/2ML IJ SOLN
INTRAMUSCULAR | Status: AC
  Administered 2021-11-16: 4 mg via INTRAVENOUS
  Filled 2021-11-16: qty 2

## 2021-11-16 MED ORDER — HYDROMORPHONE HCL 1 MG/ML IJ SOLN
1.0000 mg | Freq: Once | INTRAMUSCULAR | Status: AC
  Administered 2021-11-16: 1 mg via INTRAVENOUS
  Filled 2021-11-16: qty 1

## 2021-11-16 MED ORDER — OXYCODONE-ACETAMINOPHEN 5-325 MG PO TABS: 1.0000 | ORAL_TABLET | Freq: Four times a day (QID) | ORAL | 0 refills | Status: DC | PRN

## 2021-11-16 NOTE — ED Provider Notes (Signed)
[facility] Provider Note  Patient Contact: 3:31 PM (approximate)   History   Abdominal Pain   HPI  Jeffrey Peck is a 31 y.o. male who presents emergency department complaining of left abdominal and groin pain.  Patient states that he had pain starting in his left lower abdomen/groin region.  He states that this was not testicular or penile pain, states that this was low pelvic pain.  It is rating up into the left side.  He has had no urinary changes with hematuria, dysuria, polyuria.  No decrease in urination either.  Patient denies any history of GI illness such as Crohn's, colitis, diverticulosis or diverticulitis.  No fevers or chills.  Patient states that the pain is a 10 out of 10 even with the 2 doses of pain medicine so far he has had no relief of the pain.  Patient denies any URI symptoms.  Denies any history of nephrolithiasis.  Denies any penile drainage.     Physical Exam   Triage Vital Signs: ED Triage Vitals  Enc Vitals Group     BP 11/16/21 1333 (!) 137/105     Pulse Rate 11/16/21 1333 64     Resp 11/16/21 1333 18     Temp 11/16/21 1333 98.6 F (37 C)     Temp Source 11/16/21 1333 Oral     SpO2 11/16/21 1333 100 %     Weight 11/16/21 1332 200 lb (90.7 kg)     Height 11/16/21 1332 6\' 1"  (1.854 m)     Head Circumference --      Peak Flow --      Pain Score 11/16/21 1332 10     Pain Loc --      Pain Edu? --      Excl. in [place]? --     Most recent vital signs: Vitals:   11/16/21 1526 11/16/21 1635  BP: (!) 170/110 137/90  Pulse: 64 63  Resp: 16 17  Temp:    SpO2: 100% 100%     General: Alert and in no acute distress.  Cardiovascular:  Good peripheral perfusion Respiratory: Normal respiratory effort without tachypnea or retractions. Lungs CTAB. Good air entry to the bases with no decreased or absent breath sounds. Gastrointestinal: Bowel sounds 4 quadrants.  Soft to palpation all quadrants.  Patient is tender in the left lower  quadrant of his abdomen.  No palpable abnormalities.  Soft and nontender to palpation. No guarding or rigidity. No palpable masses. No distention.  Left CVA tenderness. Musculoskeletal: Full range of motion to all extremities.  Neurologic:  No gross focal neurologic deficits are appreciated.  Skin:   No rash noted Other:   ED Results / Procedures / Treatments   Labs (all labs ordered are listed, but only abnormal results are displayed) Labs Reviewed  COMPREHENSIVE METABOLIC PANEL - Abnormal; Notable for the following components:      Result Value   Potassium 3.4 (*)    Glucose, Bld 142 (*)    Creatinine, Ser 1.42 (*)    All other components within normal limits  URINALYSIS, ROUTINE W REFLEX MICROSCOPIC - Abnormal; Notable for the following components:   Color, Urine YELLOW (*)    APPearance CLEAR (*)    Glucose, UA 50 (*)    Hgb urine dipstick MODERATE (*)    All other components within normal limits  LIPASE, BLOOD  CBC     EKG     RADIOLOGY  I personally viewed and  evaluated these images as part of my medical decision making, as well as reviewing the written report by the radiologist.  ED Provider Interpretation: Visualization of the CT scan revealed what appears to be a 2 mm calculus at the left UVJ.  There is moderate left-sided hydronephrosis and hydroureter.  It appears that 2 mm stone is obstructing  CT ABDOMEN PELVIS W CONTRAST  Result Date: 11/16/2021 CLINICAL DATA:  Sudden onset left lower quadrant pain for 1 hour EXAM: CT ABDOMEN AND PELVIS WITH CONTRAST TECHNIQUE: Multidetector CT imaging of the abdomen and pelvis was performed using the standard protocol following bolus administration of intravenous contrast. RADIATION DOSE REDUCTION: This exam was performed according to the departmental dose-optimization program which includes automated exposure control, adjustment of the mA and/or kV according to patient size and/or use of iterative reconstruction technique.  CONTRAST:  137mL OMNIPAQUE IOHEXOL 300 MG/ML  SOLN COMPARISON:  None. FINDINGS: Lower chest: No acute pleural or parenchymal lung disease. Hepatobiliary: Mild diffuse hepatic steatosis. No focal liver abnormality. Gallbladder is unremarkable. Pancreas: Unremarkable. No pancreatic ductal dilatation or surrounding inflammatory changes. Spleen: Normal in size without focal abnormality. Adrenals/Urinary Tract: There is an obstructing 2 mm left UVJ calculus reference image 80/2. There is mild to moderate left-sided hydronephrosis and hydroureter, with delayed enhancement of the left renal parenchyma suggesting significant obstruction. Simple right renal cortical cyst. No right-sided calculi or obstructive uropathy. The bladder is otherwise unremarkable.  The adrenals are normal. Stomach/Bowel: No bowel obstruction or ileus. Normal appendix right lower quadrant. No bowel wall thickening or inflammatory change. Vascular/Lymphatic: No significant vascular findings are present. No enlarged abdominal or pelvic lymph nodes. Reproductive: Prostate is unremarkable. Other: No free fluid or free gas.  No abdominal wall hernia. Musculoskeletal: No acute or destructive bony lesions. Reconstructed images demonstrate no additional findings. IMPRESSION: 1. Obstructing 2 mm left UVJ calculus, with mild to moderate left-sided hydronephrosis and hydroureter. 2. Mild hepatic steatosis. Electronically Signed   By: Randa Ngo M.D.   On: 11/16/2021 15:20    PROCEDURES:  Critical Care performed: No  Procedures   MEDICATIONS ORDERED IN ED: Medications  ondansetron [facility]) injection 4 mg (0 mg Intravenous Hold 11/16/21 1613)  fentaNYL (SUBLIMAZE) injection 50 mcg (50 mcg Intravenous Given 11/16/21 1432)  ondansetron (ZOFRAN) injection 4 mg (4 mg Intravenous Given 11/16/21 1344)  iohexol (OMNIPAQUE) 300 MG/ML solution 100 mL (100 mLs Intravenous Contrast Given 11/16/21 1503)  HYDROmorphone (DILAUDID) injection 1 mg (1 mg  Intravenous Given 11/16/21 1541)  HYDROmorphone (DILAUDID) injection 1 mg (1 mg Intravenous Given 11/16/21 1706)  ketorolac (TORADOL) 30 MG/ML injection 30 mg (30 mg Intravenous Given 11/16/21 1706)  tamsulosin (FLOMAX) capsule 0.4 mg (0.4 mg Oral Given 11/16/21 1706)     IMPRESSION / MDM / ASSESSMENT AND PLAN / ED COURSE  I reviewed the triage vital signs and the nursing notes.                              Differential diagnosis includes, but is not limited to, colitis, diverticulitis, STD, UTI, nephrolithiasis, Crohn's   Patient's diagnosis is consistent with nephrolithiasis.  Patient presents emergency department with sudden onset of sharp left-sided abdominal pain.  This was described as deep in his pelvis radiating up into his abdomen.  No fevers or chills.  No changes in urination.  No changes in GI bowel habits.  Patient had labs which are reassuring with no elevation of white blood cell count.  He does have a bump in his creatinine.  Patient has no evidence of infection on urinalysis.  Imaging revealed that he has a 2 mm stone in the left UVJ.  There is hydronephrosis and hydroureter.  Radiologist commented on the fact that the patient had some renal parenchymal changes with contrast.  Radiologist was concerned that this could reveal severe obstruction.  I discussed the patient with on-call urologist, Dr. Caprice Beaver.  Dr. Jeb Levering advises given the fact that this is a 2 mm stone already in the UVJ that he feels that this will likely pass on its own.  He states that the findings from radiology is likely due to the fact that radiology is not typically used to seeing contrast in the kidneys and he would expect some delay in filling due to the obstruction from kidney stone.  He does not feel that this patient needs admission for any surgical procedure at this time.  He recommends anti-inflammatory, Flomax, pain medications.  Patient is given Dilaudid, Toradol and Flomax prior to discharge. Prescriptions  will be written for Percocet, Flomax, Toradol tablets.  Return precautions discussed with the patient.  Otherwise follow-up with urology or primary care as needed.. Patient is given ED precautions to return to the ED for any worsening or new symptoms.        FINAL CLINICAL IMPRESSION(S) / ED DIAGNOSES   Final diagnoses:  Left nephrolithiasis     Rx / DC Orders   ED Discharge Orders          Ordered    oxyCODONE-acetaminophen (PERCOCET/ROXICET) 5-325 MG tablet  Every 6 hours PRN        11/16/21 1710    ketorolac (TORADOL) 10 MG tablet  Every 6 hours PRN        11/16/21 1710    tamsulosin (FLOMAX) 0.4 MG CAPS capsule  Daily        11/16/21 1710             Note:  This document was prepared using Dragon voice recognition software and may include unintentional dictation errors.   Darletta Moll, PA-C 11/16/21 1714    Duffy Bruce, MD 11/20/21 1110

## 2021-11-16 NOTE — ED Triage Notes (Signed)
Pt c/o sudden onset of LLQ in the past hour. Denies vomiting. Pt is fidgety with moaning with pain on arrival

## 2022-03-12 ENCOUNTER — Encounter (HOSPITAL_COMMUNITY): Payer: Self-pay | Admitting: Emergency Medicine

## 2022-03-12 ENCOUNTER — Ambulatory Visit (HOSPITAL_COMMUNITY)
Admission: EM | Admit: 2022-03-12 | Discharge: 2022-03-12 | Disposition: A | Discharge status: Home / Self Care | Payer: Self-pay | Attending: Physician Assistant | Admitting: Physician Assistant

## 2022-03-12 DIAGNOSIS — J069 Acute upper respiratory infection, unspecified: Secondary | ICD-10-CM

## 2022-03-12 DIAGNOSIS — R509 Fever, unspecified: Secondary | ICD-10-CM | POA: Insufficient documentation

## 2022-03-12 DIAGNOSIS — J029 Acute pharyngitis, unspecified: Secondary | ICD-10-CM

## 2022-03-12 LAB — POCT RAPID STREP A, ED / UC: Streptococcus, Group A Screen (Direct): NEGATIVE

## 2022-03-12 MED ORDER — IBUPROFEN 800 MG PO TABS
ORAL_TABLET | ORAL | Status: AC
  Filled 2022-03-12: qty 1

## 2022-03-12 MED ORDER — AMOXICILLIN 500 MG PO CAPS: 500.0000 mg | ORAL_CAPSULE | Freq: Three times a day (TID) | ORAL | 0 refills | Status: DC

## 2022-03-12 MED ORDER — IBUPROFEN 800 MG PO TABS
800.0000 mg | ORAL_TABLET | Freq: Once | ORAL | Status: AC
  Administered 2022-03-12: 800 mg via ORAL

## 2022-03-12 NOTE — ED Provider Notes (Signed)
[facility]    CSN: [id number] Arrival date & time: 03/12/22  1609      History   Chief Complaint No chief complaint on file.   HPI Jeffrey Peck is a 31 y.o. male.   31 year old male presents with sore throat.  Patient relates that he started last evening with a sore throat which has been progressively worse over the past several hours.  She relates he has painful swallowing, fever, chills.  Patient relates that he is also having body aches and body pain.  Patient relates that he just started having headache which is diffuse all over the head.  Patient is also having some upper respiratory symptoms with some nasal congestion.  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  Patient relates he is not having any nausea or vomiting.  Patient did get some mild relief of the sore throat with Tylenol earlier this morning.  Patient relates he is not having any cough or congestion.     History reviewed. No pertinent past medical history.  There are no problems to display for this patient.   History reviewed. No pertinent surgical history.     Home Medications    Prior to Admission medications   Medication Sig Start Date End Date Taking? Authorizing Provider  amoxicillin (AMOXIL) 500 MG capsule Take 1 capsule (500 mg total) by mouth 3 (three) times daily. 03/12/22  Yes Ellsworth Lennox, PA-C  ketorolac (TORADOL) 10 MG tablet Take 1 tablet (10 mg total) by mouth every 6 (six) hours as needed. 11/16/21   Cuthriell, Delorise Royals, PA-C  oxyCODONE-acetaminophen (PERCOCET/ROXICET) 5-325 MG tablet Take 1 tablet by mouth every 6 (six) hours as needed for severe pain. 11/16/21   Cuthriell, Delorise Royals, PA-C  predniSONE (DELTASONE) 20 MG tablet Take 2 tablets (40 mg total) by mouth daily with breakfast. 01/27/21   Bing Neighbors, FNP  promethazine-dextromethorphan (PROMETHAZINE-DM) 6.25-15  MG/5ML syrup Take 5 mLs by mouth 4 (four) times daily as needed for cough. 01/27/21   Bing Neighbors, FNP  tamsulosin (FLOMAX) 0.4 MG CAPS capsule Take 1 capsule (0.4 mg total) by mouth daily. 11/16/21   Cuthriell, Delorise Royals, PA-C    Family History History reviewed. No pertinent family history.  Social History Social History   Tobacco Use   Smoking status: Never   Smokeless tobacco: Never  Substance Use Topics   Alcohol use: Yes    Comment: occasional   Drug use: No     Allergies   Patient has no known allergies.   Review of Systems Review of Systems  Constitutional:  Positive for fatigue and fever.  HENT:  Positive for sore throat.      Physical Exam Triage Vital Signs ED Triage Vitals  Enc Vitals Group     BP 03/12/22 1629 (!) 154/90     Pulse Rate 03/12/22 1629 (!) 106     Resp 03/12/22 1629 16     Temp 03/12/22 1629 99.5 F (37.5 C)     Temp Source 03/12/22 1629 Oral     SpO2 03/12/22 1629 99 %     Weight --      Height --      Head Circumference --      Peak Flow --      Pain Score 03/12/22 1630 8     Pain Loc --  Pain Edu? --      Excl. in GC? --    No data found.  Updated Vital Signs BP (!) 154/90 (BP Location: Right Arm)   Pulse (!) 106   Temp 99.5 F (37.5 C) (Oral)   Resp 16   SpO2 99%   Visual Acuity Right Eye Distance:   Left Eye Distance:   Bilateral Distance:    Right Eye Near:   Left Eye Near:    Bilateral Near:     Physical Exam Constitutional:      Appearance: Normal appearance.  HENT:     Right Ear: Tympanic membrane and ear canal normal.     Left Ear: Tympanic membrane and ear canal normal.     Mouth/Throat:     Mouth: Mucous membranes are moist.     Pharynx: Uvula midline. Posterior oropharyngeal erythema present. No oropharyngeal exudate.  Cardiovascular:     Rate and Rhythm: Normal rate and regular rhythm.     Heart sounds: Normal heart sounds.  Pulmonary:     Effort: Pulmonary effort is normal.      Breath sounds: Normal breath sounds and air entry. No wheezing, rhonchi or rales.  Lymphadenopathy:     Cervical: No cervical adenopathy.  Neurological:     Mental Status: He is alert.      UC Treatments / Results  Labs (all labs ordered are listed, but only abnormal results are displayed) Labs Reviewed  CULTURE, GROUP A STREP [facility])  POCT RAPID STREP A, ED / UC    EKG   Radiology No results found.  Procedures Procedures (including critical care time)  Medications Ordered in UC Medications  ibuprofen (ADVIL) tablet 800 mg (800 mg Oral Given 03/12/22 1731)    Initial Impression / Assessment and Plan / UC Course  I have reviewed the triage vital signs and the nursing notes.  Pertinent labs & imaging results that were available during my care of the patient were reviewed by me and considered in my medical decision making (see chart for details).    Plan: 1.  Advised take ibuprofen 600 mg every 8 hours to help reduce pain and fever. 2.  Advised use Epsom salt gargles frequently to help soothe the throat along with lozenges. 3.  Advised to take amoxicillin 500 mg 1 3 times a day until completed. Throat culture is pending 4.  Advised to follow-up with PCP or return to urgent care if symptoms fail to improve Final Clinical Impressions(s) / UC Diagnoses   Final diagnoses:  Fever, unspecified  Acute pharyngitis, unspecified etiology  Acute upper respiratory infection     Discharge Instructions      Advised to take ibuprofen 600 mg every 8 hours to help relieve the pain and the sore throat. Advised to use use frequent Epsom salt gargles to help soothe the throat along with lozenges. Advised to take the medication as directed. Advised to follow-up with PCP or return to urgent care if symptoms fail to improve     ED Prescriptions     Medication Sig Dispense Auth. Provider   amoxicillin (AMOXIL) 500 MG capsule Take 1 capsule (500 mg total) by mouth 3 (three) times  daily. 21 capsule Ellsworth Lennox, PA-C      PDMP not reviewed this encounter.   Ellsworth Lennox, PA-C 03/12/22 1750

## 2022-03-14 ENCOUNTER — Other Ambulatory Visit: Payer: Self-pay

## 2022-03-14 ENCOUNTER — Encounter (HOSPITAL_BASED_OUTPATIENT_CLINIC_OR_DEPARTMENT_OTHER): Payer: Self-pay | Admitting: *Deleted

## 2022-03-14 ENCOUNTER — Emergency Department (HOSPITAL_BASED_OUTPATIENT_CLINIC_OR_DEPARTMENT_OTHER)
Admission: EM | Admit: 2022-03-14 | Discharge: 2022-03-15 | Disposition: A | Discharge status: Home / Self Care | Payer: Self-pay | Attending: Emergency Medicine | Admitting: Emergency Medicine

## 2022-03-14 DIAGNOSIS — R059 Cough, unspecified: Secondary | ICD-10-CM | POA: Insufficient documentation

## 2022-03-14 DIAGNOSIS — R0981 Nasal congestion: Secondary | ICD-10-CM | POA: Insufficient documentation

## 2022-03-14 DIAGNOSIS — R509 Fever, unspecified: Secondary | ICD-10-CM | POA: Insufficient documentation

## 2022-03-14 DIAGNOSIS — J069 Acute upper respiratory infection, unspecified: Secondary | ICD-10-CM

## 2022-03-14 DIAGNOSIS — M791 Myalgia, unspecified site: Secondary | ICD-10-CM | POA: Insufficient documentation

## 2022-03-14 DIAGNOSIS — Z20822 Contact with and (suspected) exposure to covid-19: Secondary | ICD-10-CM | POA: Insufficient documentation

## 2022-03-14 LAB — CULTURE, GROUP A STREP (THRC)

## 2022-03-14 LAB — SARS CORONAVIRUS 2 BY RT PCR: SARS Coronavirus 2 by RT PCR: NEGATIVE

## 2022-03-14 NOTE — ED Triage Notes (Signed)
Pt is here for chills, body aches and generalized illness which began on Friday.  He was seen at Urgent care on Saturday and had advises that he was dc with amoxicillin but his strep was negative and he received a call today to stop taking it as strep was negative.

## 2022-03-15 MED ORDER — DOXYCYCLINE HYCLATE 100 MG PO CAPS: 100.0000 mg | ORAL_CAPSULE | Freq: Two times a day (BID) | ORAL | 0 refills | Status: DC

## 2022-03-15 NOTE — ED Provider Notes (Signed)
[facility] EMERGENCY DEPT Provider Note   CSN: [id number] Arrival date & time: 03/14/22  2120     History  Chief Complaint  Patient presents with   Illness    Jeffrey Peck is a 31 y.o. male.  Patient is a 31 year old male with no significant past medical history.  Patient presenting with complaints of fever, body aches, congestion, cough, and feeling generally unwell.  This has been worsening over the past week.  He reports seeing last week at urgent care.  He had a strep test performed and was started on amoxicillin, but does not feel as though he is improving.  He was called today and told that his strep test was negative and that he should stop taking the amoxicillin.  He presents here stating "I just want to feel better".  He reports taking ibuprofen and Alka-Seltzer cough and cold with little relief.  The history is provided by the patient.       Home Medications Prior to Admission medications   Medication Sig Start Date End Date Taking? Authorizing Provider  amoxicillin (AMOXIL) 500 MG capsule Take 1 capsule (500 mg total) by mouth 3 (three) times daily. 03/12/22   Ellsworth Lennox, PA-C  ketorolac (TORADOL) 10 MG tablet Take 1 tablet (10 mg total) by mouth every 6 (six) hours as needed. 11/16/21   Cuthriell, Delorise Royals, PA-C  oxyCODONE-acetaminophen (PERCOCET/ROXICET) 5-325 MG tablet Take 1 tablet by mouth every 6 (six) hours as needed for severe pain. 11/16/21   Cuthriell, Delorise Royals, PA-C  predniSONE (DELTASONE) 20 MG tablet Take 2 tablets (40 mg total) by mouth daily with breakfast. 01/27/21   Bing Neighbors, FNP  promethazine-dextromethorphan (PROMETHAZINE-DM) 6.25-15 MG/5ML syrup Take 5 mLs by mouth 4 (four) times daily as needed for cough. 01/27/21   Bing Neighbors, FNP  tamsulosin (FLOMAX) 0.4 MG CAPS capsule Take 1 capsule (0.4 mg total) by mouth daily. 11/16/21   Cuthriell, Delorise Royals, PA-C      Allergies    Patient has no known allergies.     Review of Systems   Review of Systems  All other systems reviewed and are negative.   Physical Exam Updated Vital Signs BP (!) 164/109   Pulse 83   Resp 16   SpO2 99%  Physical Exam Vitals and nursing note reviewed.  Constitutional:      General: He is not in acute distress.    Appearance: He is well-developed. He is not diaphoretic.  HENT:     Head: Normocephalic and atraumatic.  Cardiovascular:     Rate and Rhythm: Normal rate and regular rhythm.     Heart sounds: No murmur heard.    No friction rub.  Pulmonary:     Effort: Pulmonary effort is normal. No respiratory distress.     Breath sounds: Normal breath sounds. No wheezing or rales.  Abdominal:     General: Bowel sounds are normal. There is no distension.     Palpations: Abdomen is soft.     Tenderness: There is no abdominal tenderness.  Musculoskeletal:        General: Normal range of motion.     Cervical back: Normal range of motion and neck supple.  Skin:    General: Skin is warm and dry.  Neurological:     Mental Status: He is alert and oriented to person, place, and time.     Coordination: Coordination normal.     ED Results / Procedures / Treatments   Labs (  all labs ordered are listed, but only abnormal results are displayed) Labs Reviewed  SARS CORONAVIRUS 2 BY RT PCR    EKG None  Radiology No results found.  Procedures Procedures    Medications Ordered in ED Medications - No data to display  ED Course/ Medical Decision Making/ A&P  Symptoms likely viral in nature.  But due to the prolonged nature of the illness and worsening symptoms, I will change his antibiotic from amoxicillin to doxycycline and see if this helps.  Patient to follow-up with primary if not improving.  Final Clinical Impression(s) / ED Diagnoses Final diagnoses:  None    Rx / DC Orders ED Discharge Orders     None         Geoffery Lyons, MD 03/15/22 0004

## 2022-10-12 ENCOUNTER — Ambulatory Visit: Payer: Self-pay | Admitting: Family Medicine

## 2022-10-13 ENCOUNTER — Ambulatory Visit: Payer: Self-pay | Admitting: Family Medicine

## 2022-10-15 IMAGING — CT CT ABD-PELV W/ CM
2 of 4 series · 16 of 46 positions shown, 18 images · IV contrast (APPLIED)
Comparison: None.

CLINICAL DATA: Sudden onset left lower quadrant pain for 1 hour

EXAM:
CT ABDOMEN AND PELVIS WITH CONTRAST
TECHNIQUE: Multidetector CT imaging of the abdomen and pelvis was performed
using the standard protocol following bolus administration of
intravenous contrast.

[Series 2: abdomen 5.0 · axial · 0.76mm/px · z∈[-1350,-880]mm · 13 of 104 slices shown, 15 images]
[im 5/104  soft-tissue]
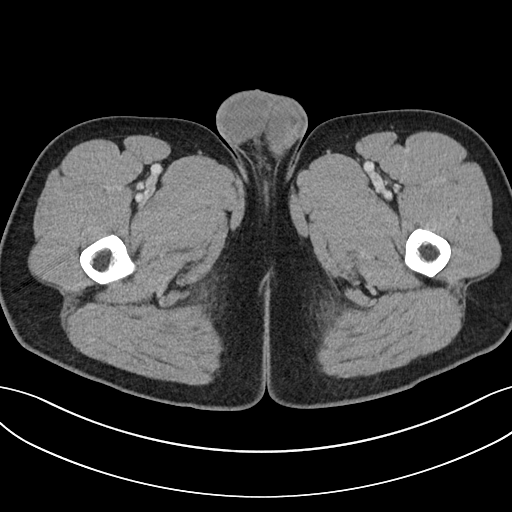
[im 5/104  bone]
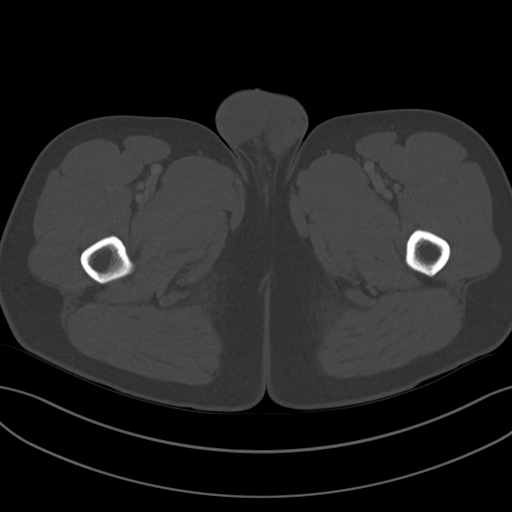
[im 15/104  soft-tissue]
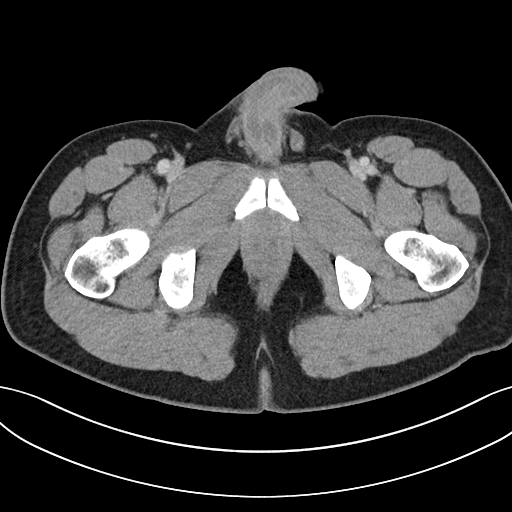
[im 20/104  soft-tissue]
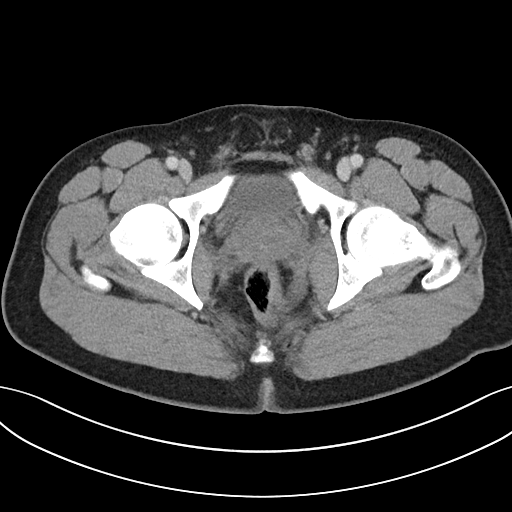
[im 30/104  soft-tissue]
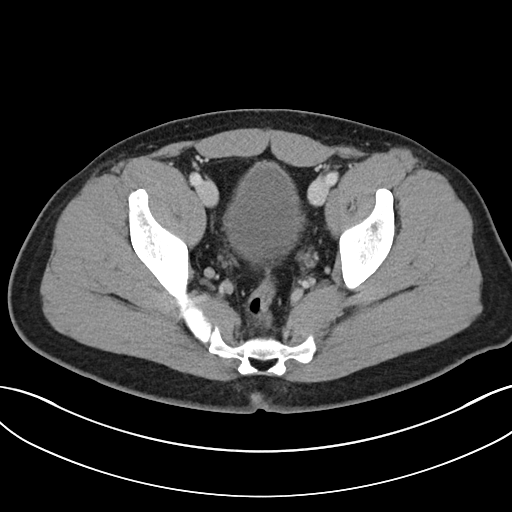
[im 35/104  soft-tissue]
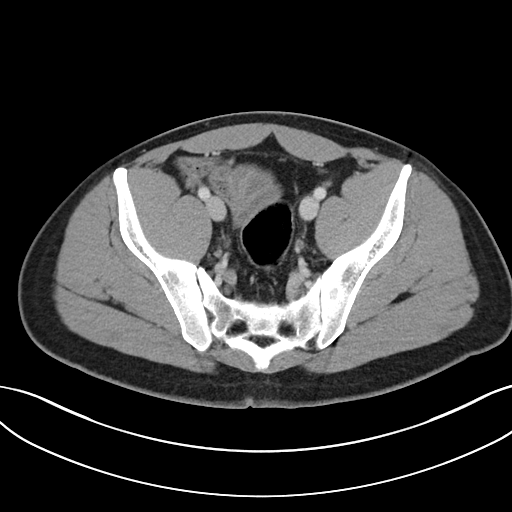
[im 45/104  soft-tissue]
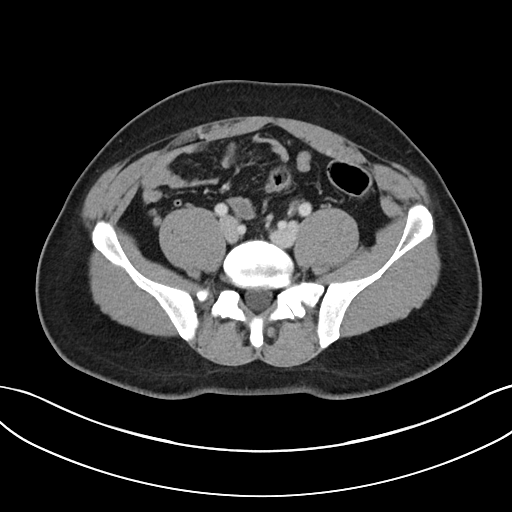
[im 54/104  soft-tissue]
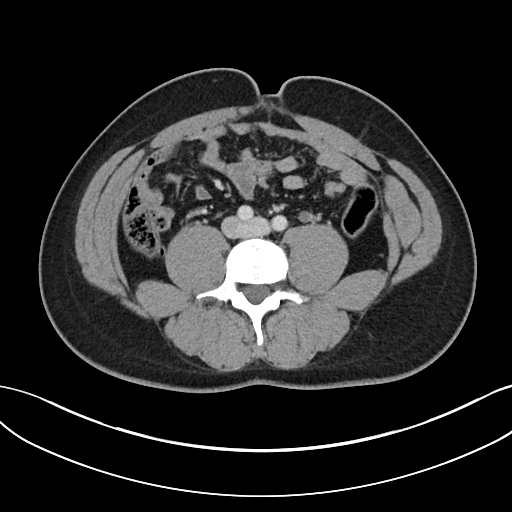
[im 59/104  soft-tissue]
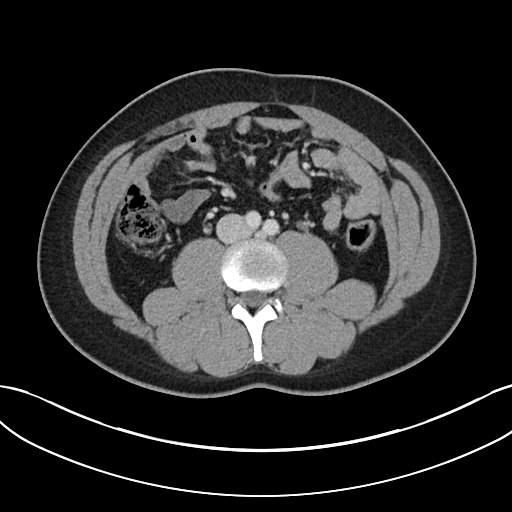
[im 69/104  soft-tissue]
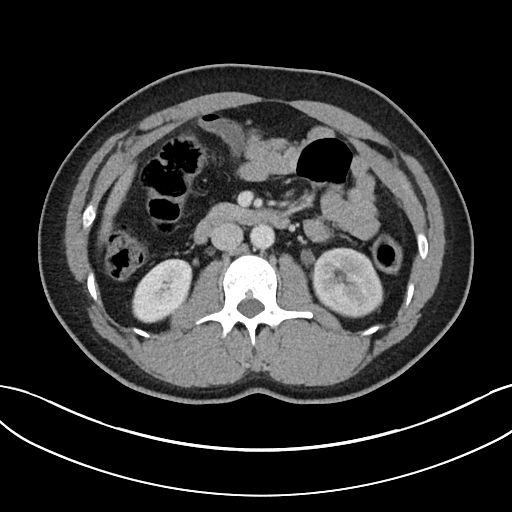
[im 69/104  bone]
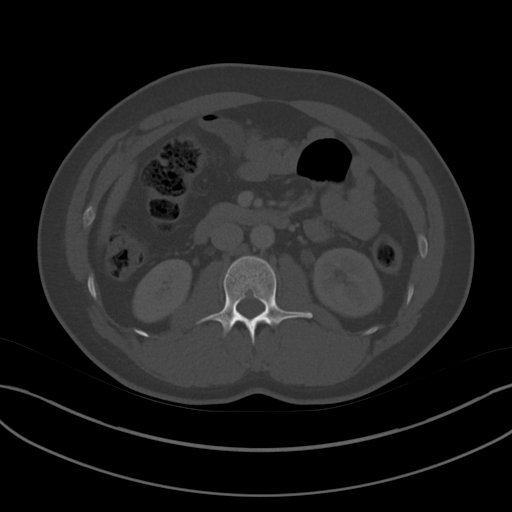
[im 74/104  soft-tissue]
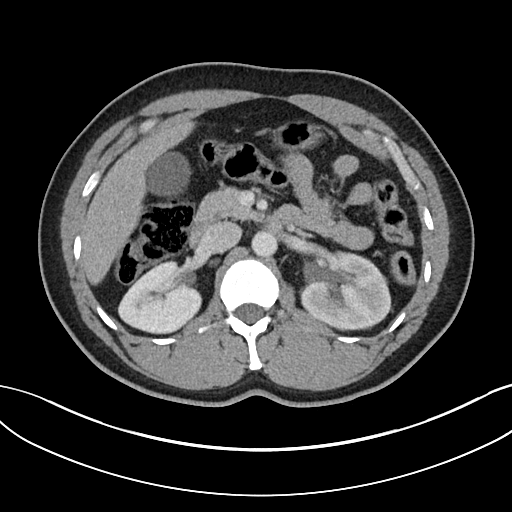
[im 84/104  soft-tissue]
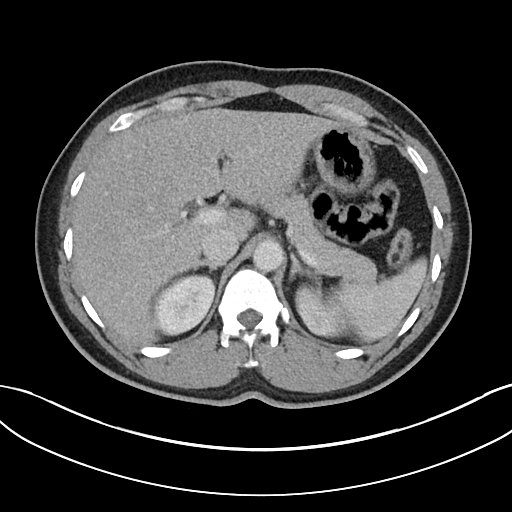
[im 89/104  soft-tissue]
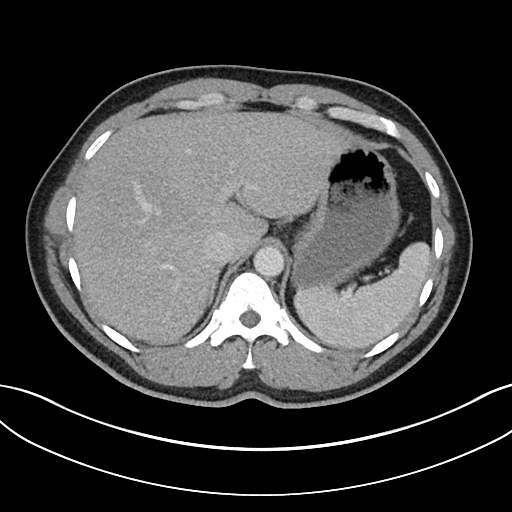
[im 99/104  soft-tissue]
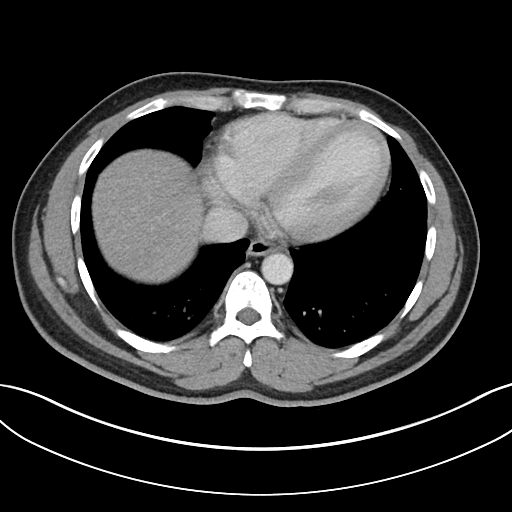

[Series 5: abdomen 3.0 mpr cor · coronal · 0.80mm/px · 3 of 91 slices shown]
[im 31/91  soft-tissue]
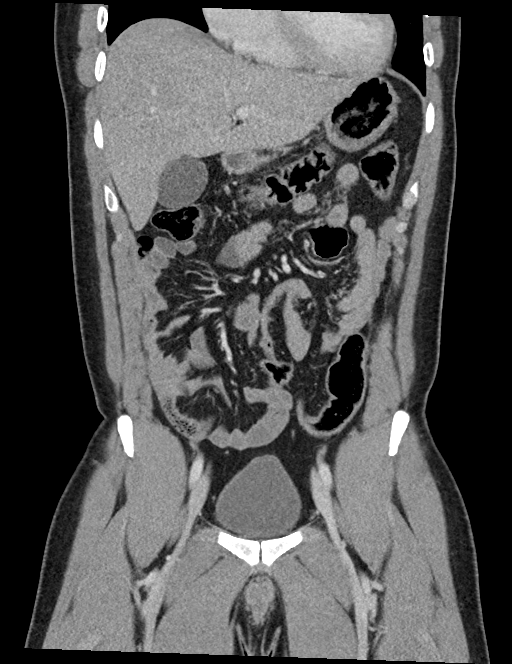
[im 41/91  soft-tissue]
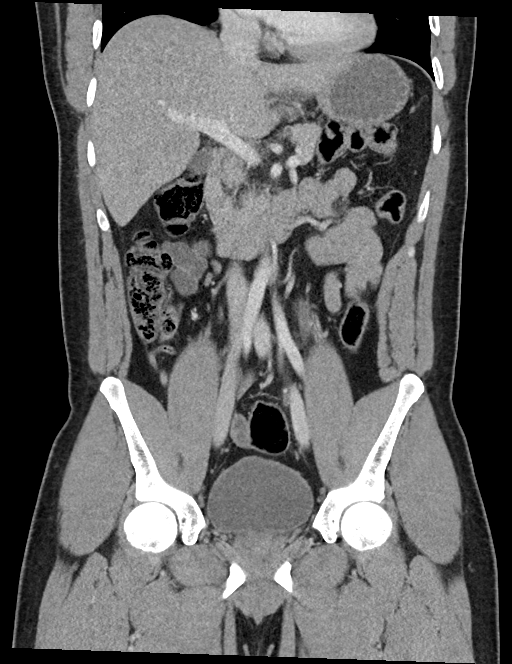
[im 51/91  soft-tissue]
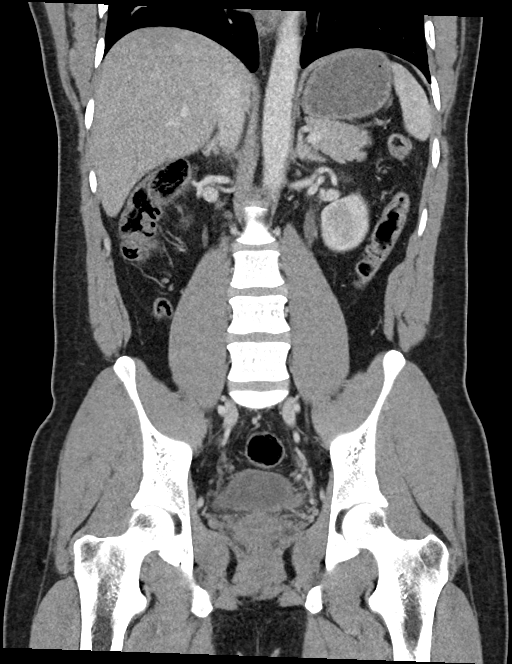

[16 of 46 positions shown; findings below may reference images not displayed]

RADIATION DOSE REDUCTION: This exam was performed according to the
departmental dose-optimization program which includes automated
exposure control, adjustment of the mA and/or kV according to
patient size and/or use of iterative reconstruction technique.

CONTRAST:  100mL OMNIPAQUE IOHEXOL 300 MG/ML  SOLN
FINDINGS: Lower chest: No acute pleural or parenchymal lung disease.

Hepatobiliary: Mild diffuse hepatic steatosis. No focal liver
abnormality. Gallbladder is unremarkable.

Pancreas: Unremarkable. No pancreatic ductal dilatation or
surrounding inflammatory changes.

Spleen: Normal in size without focal abnormality.

Adrenals/Urinary Tract: There is an obstructing 2 mm left UVJ
calculus reference image 80/2. There is mild to moderate left-sided
hydronephrosis and hydroureter, with delayed enhancement of the left
renal parenchyma suggesting significant obstruction.

Simple right renal cortical cyst. No right-sided calculi or
obstructive uropathy.

The bladder is otherwise unremarkable.  The adrenals are normal.

Stomach/Bowel: No bowel obstruction or ileus. Normal appendix right
lower quadrant. No bowel wall thickening or inflammatory change.

Vascular/Lymphatic: No significant vascular findings are present. No
enlarged abdominal or pelvic lymph nodes.

Reproductive: Prostate is unremarkable.

Other: No free fluid or free gas.  No abdominal wall hernia.

Musculoskeletal: No acute or destructive bony lesions. Reconstructed
images demonstrate no additional findings.
IMPRESSION: 1. Obstructing 2 mm left UVJ calculus, with mild to moderate
left-sided hydronephrosis and hydroureter.
2. Mild hepatic steatosis.

## 2022-10-17 ENCOUNTER — Ambulatory Visit: Payer: Self-pay | Admitting: Family Medicine

## 2022-10-17 NOTE — Progress Notes (Deleted)
I,Delanee Xin R Brenn Gatton,acting as a Education administrator for Gwyneth Sprout, FNP.,have documented all relevant documentation on the behalf of Gwyneth Sprout, FNP,as directed by  Gwyneth Sprout, FNP while in the presence of Gwyneth Sprout, FNP.  New patient visit   Patient: Jeffrey Peck   DOB: 08/27/1991   32 y.o. Male  MRN: [id number] Visit Date: 10/17/2022  Today's healthcare provider: Gwyneth Sprout, FNP   No chief complaint on file.  Subjective    Jeffrey Peck is a 32 y.o. male who presents today as a new patient to establish care.  HPI  ***  No past medical history on file. No past surgical history on file. Family Status  Relation Name Status   Mother  Alive   Father  Alive   No family history on file. Social History   Socioeconomic History   Marital status: Single    Spouse name: Not on file   Number of children: Not on file   Years of education: Not on file   Highest education level: Not on file  Occupational History   Not on file  Tobacco Use   Smoking status: Never   Smokeless tobacco: Never  Substance and Sexual Activity   Alcohol use: Yes    Comment: occasional   Drug use: No   Sexual activity: Not on file  Other Topics Concern   Not on file  Social History Narrative   Not on file   Social Determinants of Health   Financial Resource Strain: Not on file  Food Insecurity: Not on file  Transportation Needs: Not on file  Physical Activity: Not on file  Stress: Not on file  Social Connections: Not on file   Outpatient Medications Prior to Visit  Medication Sig   amoxicillin (AMOXIL) 500 MG capsule Take 1 capsule (500 mg total) by mouth 3 (three) times daily.   doxycycline (VIBRAMYCIN) 100 MG capsule Take 1 capsule (100 mg total) by mouth 2 (two) times daily. One po bid x 7 days   ketorolac (TORADOL) 10 MG tablet Take 1 tablet (10 mg total) by mouth every 6 (six) hours as needed.   oxyCODONE-acetaminophen (PERCOCET/ROXICET) 5-325 MG tablet Take 1 tablet by mouth  every 6 (six) hours as needed for severe pain.   predniSONE (DELTASONE) 20 MG tablet Take 2 tablets (40 mg total) by mouth daily with breakfast.   promethazine-dextromethorphan (PROMETHAZINE-DM) 6.25-15 MG/5ML syrup Take 5 mLs by mouth 4 (four) times daily as needed for cough.   tamsulosin (FLOMAX) 0.4 MG CAPS capsule Take 1 capsule (0.4 mg total) by mouth daily.   No facility-administered medications prior to visit.   No Known Allergies  Immunization History  Administered Date(s) Administered   Janssen (J&J) SARS-COV-2 Vaccination 12/26/2019    Health Maintenance  Topic Date Due   HIV Screening  Never done   Hepatitis C Screening  Never done   DTaP/Tdap/Td (1 - Tdap) Never done   INFLUENZA VACCINE  Never done   COVID-19 Vaccine (2 - 2023-24 season) 05/20/2022   HPV VACCINES  Aged Out    Patient Care Team: Pcp, No as PCP - General  Review of Systems  {Labs  Heme  Chem  Endocrine  Serology  Results Review (optional):23779}   Objective    There were no vitals taken for this visit. {Show previous vital signs (optional):23777}  Physical Exam ***  Depression Screen     No data to display  No results found for any visits on 10/17/22.  Assessment & Plan     ***  No follow-ups on file.     {provider attestation***:1}   Gwyneth Sprout, [place] [phone] (phone) [phone] (fax)  [place]

## 2023-03-20 ENCOUNTER — Emergency Department (HOSPITAL_BASED_OUTPATIENT_CLINIC_OR_DEPARTMENT_OTHER)
Admission: EM | Admit: 2023-03-20 | Discharge: 2023-03-20 | Disposition: A | Discharge status: Home / Self Care | Payer: BC Managed Care – PPO | Attending: Emergency Medicine | Admitting: Emergency Medicine

## 2023-03-20 ENCOUNTER — Other Ambulatory Visit: Payer: Self-pay

## 2023-03-20 ENCOUNTER — Encounter (HOSPITAL_BASED_OUTPATIENT_CLINIC_OR_DEPARTMENT_OTHER): Payer: Self-pay

## 2023-03-20 DIAGNOSIS — I10 Essential (primary) hypertension: Secondary | ICD-10-CM | POA: Diagnosis present

## 2023-03-20 DIAGNOSIS — Z79899 Other long term (current) drug therapy: Secondary | ICD-10-CM | POA: Insufficient documentation

## 2023-03-20 LAB — BASIC METABOLIC PANEL
Anion gap: 9 (ref 5–15)
BUN: 13 mg/dL (ref 6–20)
CO2: 28 mmol/L (ref 22–32)
Calcium: 10.3 mg/dL (ref 8.9–10.3)
Chloride: 100 mmol/L (ref 98–111)
Creatinine, Ser: 1.34 mg/dL — ABNORMAL HIGH (ref 0.61–1.24)
GFR, Estimated: >60 mL/min (ref >60)
Glucose, Bld: 108 mg/dL — ABNORMAL HIGH (ref 70–99)
Potassium: 3.3 mmol/L — ABNORMAL LOW (ref 3.5–5.1)
Sodium: 137 mmol/L (ref 135–145)

## 2023-03-20 LAB — CBC
HCT: 46 % (ref 39.0–52.0)
Hemoglobin: 15.6 g/dL (ref 13.0–17.0)
MCH: 29.2 pg (ref 26.0–34.0)
MCHC: 33.9 g/dL (ref 30.0–36.0)
MCV: 86.1 fL (ref 80.0–100.0)
Platelets: 358 10*3/uL (ref 150–400)
RBC: 5.34 MIL/uL (ref 4.22–5.81)
RDW: 12 % (ref 11.5–15.5)
WBC: 8.4 10*3/uL (ref 4.0–10.5)
nRBC: 0 % (ref 0.0–0.2)

## 2023-03-20 MED ORDER — AMLODIPINE BESYLATE 10 MG PO TABS: 10.0000 mg | ORAL_TABLET | Freq: Every day | ORAL | 2 refills | Status: DC

## 2023-03-20 MED ORDER — AMLODIPINE BESYLATE 5 MG PO TABS
10.0000 mg | ORAL_TABLET | Freq: Once | ORAL | Status: AC
  Administered 2023-03-20: 10 mg via ORAL
  Filled 2023-03-20: qty 2

## 2023-03-20 NOTE — ED Provider Notes (Signed)
[place] EMERGENCY DEPARTMENT AT [facility]  Provider Note  CSN: [id number] Arrival date & time: 03/20/23 1953  History Chief Complaint  Patient presents with   Hypertension    Jeffrey Peck is a 32 y.o. 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 He is not having any symptoms such as CP, SOB, blurry vision, headaches, etc. Denies drug or tobacco use. Occasional EtOH. He works as a Surveyor, mining.   Home Medications Prior to Admission medications   Medication Sig Start Date End Date Taking? Authorizing Provider  amLODipine (NORVASC) 10 MG tablet Take 1 tablet (10 mg total) by mouth daily. 03/20/23  Yes Pollyann Savoy, MD     Allergies    Patient has no known allergies.   Review of Systems   Review of Systems Please see HPI for pertinent positives and negatives  Physical Exam BP (!) 199/137   Pulse 79   Temp 98.4 F (36.9 C) (Oral)   Resp 17   Ht 6\' 1"  (1.854 m)   Wt 93.4 kg   SpO2 94%   BMI 27.18 kg/m   Physical Exam Vitals and nursing note reviewed.  HENT:     Head: Normocephalic.     Nose: Nose normal.  Eyes:     Extraocular Movements: Extraocular movements intact.  Cardiovascular:     Rate and Rhythm: Normal rate.  Pulmonary:     Effort: Pulmonary effort is normal.  Musculoskeletal:        General: Normal range of motion.     Cervical back: Neck supple.  Skin:    Findings: No rash (on exposed skin).  Neurological:     Mental Status: He is alert and oriented to person, place, and time.  Psychiatric:        Mood and Affect: Mood normal.     ED Results / Procedures / Treatments   EKG EKG Interpretation Date/Time:  Monday March 20 2023 22:49:25 EDT Ventricular Rate:  72 PR Interval:  183 QRS Duration:  99 QT Interval:  381 QTC Calculation: 417 R Axis:   65  Text Interpretation: Sinus rhythm Left atrial enlargement Borderline T wave abnormalities ST elev, probable normal early repol  pattern Since last tracing Non-specific ST-t changes Confirmed by Susy Frizzle [phone]) on 03/20/2023 11:12:55 PM  Procedures Procedures  Medications Ordered in the ED Medications  amLODipine (NORVASC) tablet 10 mg (has no administration in time range)    Initial Impression and Plan  Patient here with asymptomatic HTN, not currently on meds. Unknown what his baseline BP is, review of several recent ED and UC visits shows elevated readings then. He had labs done in triage showing normal CBC. BMP with mildly increased Cr, similar to previous. Will start amlodipine. Discussed plan for gradual lowering over 1-2 weeks. Encouraged to monitor BP at home and keep a log. PCP and/or [facility] follow up for recheck in 1 week.   ED Course       MDM Rules/Calculators/A&P Medical Decision Making Problems Addressed: Asymptomatic hypertension: acute illness or injury  Amount and/or Complexity of Data Reviewed Labs: ordered. Decision-making details documented in ED Course. ECG/medicine tests: ordered and independent interpretation performed. Decision-making details documented in ED Course.  Risk Prescription drug management.     Final Clinical Impression(s) / ED Diagnoses Final diagnoses:  Asymptomatic hypertension    Rx / DC Orders ED Discharge Orders  Ordered    amLODipine (NORVASC) 10 MG tablet  Daily        03/20/23 2324             Pollyann Savoy, MD 03/20/23 2324

## 2023-03-20 NOTE — ED Triage Notes (Signed)
Patient here POV from Home.  Endorses being at [facility] for Physical Assessment for Work and being told BP was high. No Diagnosis of HTN.   174/110 at UC. No Symptoms such as CP, SOB, Weakness, Numbness, N/V/D, Cough, Fevers.   NAD Noted During Triage. A&Ox4. GCS 15.

## 2023-03-27 ENCOUNTER — Ambulatory Visit: Payer: BC Managed Care – PPO | Admitting: Physician Assistant

## 2023-03-27 VITALS — BP 146/116 | HR 82 | Temp 98.5°F | Ht 73.0 in | Wt 201.0 lb

## 2023-03-27 DIAGNOSIS — R5383 Other fatigue: Secondary | ICD-10-CM | POA: Diagnosis not present

## 2023-03-27 DIAGNOSIS — Z136 Encounter for screening for cardiovascular disorders: Secondary | ICD-10-CM | POA: Diagnosis not present

## 2023-03-27 DIAGNOSIS — F908 Attention-deficit hyperactivity disorder, other type: Secondary | ICD-10-CM

## 2023-03-27 DIAGNOSIS — I10 Essential (primary) hypertension: Secondary | ICD-10-CM | POA: Diagnosis not present

## 2023-03-27 DIAGNOSIS — R030 Elevated blood-pressure reading, without diagnosis of hypertension: Secondary | ICD-10-CM

## 2023-03-27 DIAGNOSIS — Z7689 Persons encountering health services in other specified circumstances: Secondary | ICD-10-CM

## 2023-03-27 DIAGNOSIS — E7800 Pure hypercholesterolemia, unspecified: Secondary | ICD-10-CM

## 2023-03-27 MED ORDER — HYDROCHLOROTHIAZIDE 12.5 MG PO TABS: 12.5000 mg | ORAL_TABLET | Freq: Every day | ORAL | 0 refills | Status: DC

## 2023-03-27 NOTE — Progress Notes (Unsigned)
New patient visit  Patient: Jeffrey Peck   DOB: 1991/07/14   32 y.o. Male  MRN: [id number] Visit Date: 03/27/2023  Today's healthcare provider: Debera Lat, PA-C   No chief complaint on file.  Subjective    Jeffrey Peck is a 32 y.o. male who presents today as a new patient to establish care.  HPI  *** Discussed the use of AI scribe software for clinical note transcription with the patient, who gave verbal consent to proceed.  History of Present Illness            No past medical history on file. No past surgical history on file. Family Status  Relation Name Status   Mother  Alive   Father  Alive   No family history on file. Social History   Socioeconomic History   Marital status: Single    Spouse name: Not on file   Number of children: Not on file   Years of education: Not on file   Highest education level: Not on file  Occupational History   Not on file  Tobacco Use   Smoking status: Never   Smokeless tobacco: Never  Substance and Sexual Activity   Alcohol use: Yes    Comment: occasional   Drug use: No   Sexual activity: Not on file  Other Topics Concern   Not on file  Social History Narrative   Not on file   Social Determinants of Health   Financial Resource Strain: Not on file  Food Insecurity: Not on file  Transportation Needs: Not on file  Physical Activity: Not on file  Stress: Not on file  Social Connections: Not on file   Outpatient Medications Prior to Visit  Medication Sig   amLODipine (NORVASC) 10 MG tablet Take 1 tablet (10 mg total) by mouth daily.   No facility-administered medications prior to visit.   No Known Allergies  Immunization History  Administered Date(s) Administered   Janssen (J&J) SARS-COV-2 Vaccination 12/26/2019    Health Maintenance  Topic Date Due   HIV Screening  Never done   Hepatitis C Screening  Never done   DTaP/Tdap/Td (1 - Tdap) Never done   COVID-19 Vaccine (2 - 2023-24 season) 05/20/2022    INFLUENZA VACCINE  04/20/2023   HPV VACCINES  Aged Out    Patient Care Team: Pcp, No as PCP - General  Review of Systems Except see HPI   {Labs  Heme  Chem  Endocrine  Serology  Results Review (optional):23779}   Objective    There were no vitals taken for this visit. {Show previous vital signs (optional):23777}  Physical Exam  Depression Screen     No data to display         No results found for any visits on 03/27/23.  Assessment & Plan     *** Assessment and Plan              Encounter to establish care Welcomed to our clinic Reviewed past medical hx, social hx, family hx and surgical hx Pt advised to send all vaccination records or screening   No follow-ups on file.    The patient was advised to call back or seek an in-person evaluation if the symptoms worsen or if the condition fails to improve as anticipated.  I discussed the assessment and treatment plan with the patient. The patient was provided an opportunity to ask questions and all were answered. The patient agreed with the plan and demonstrated an understanding of  the instructions.  I, Debera Lat, PA-C have reviewed all documentation for this visit. The documentation on  03/27/23  for the exam, diagnosis, procedures, and orders are all accurate and complete.  Debera Lat, [facility], MMS [facility] [phone] (phone) [phone] (fax)  [facility]

## 2023-03-28 DIAGNOSIS — I10 Essential (primary) hypertension: Secondary | ICD-10-CM | POA: Insufficient documentation

## 2023-03-28 DIAGNOSIS — R5383 Other fatigue: Secondary | ICD-10-CM | POA: Insufficient documentation

## 2023-03-28 DIAGNOSIS — F909 Attention-deficit hyperactivity disorder, unspecified type: Secondary | ICD-10-CM | POA: Insufficient documentation

## 2023-03-28 DIAGNOSIS — F988 Other specified behavioral and emotional disorders with onset usually occurring in childhood and adolescence: Secondary | ICD-10-CM | POA: Insufficient documentation

## 2023-03-28 LAB — COMPREHENSIVE METABOLIC PANEL
ALT: 35 IU/L (ref 0–44)
AST: 17 IU/L (ref 0–40)
Albumin: 4.9 g/dL (ref 4.1–5.1)
Alkaline Phosphatase: 120 IU/L (ref 44–121)
BUN/Creatinine Ratio: 12 (ref 9–20)
BUN: 15 mg/dL (ref 6–20)
Bilirubin Total: 0.4 mg/dL (ref 0.0–1.2)
CO2: 24 mmol/L (ref 20–29)
Calcium: 10.1 mg/dL (ref 8.7–10.2)
Chloride: 99 mmol/L (ref 96–106)
Creatinine, Ser: 1.23 mg/dL (ref 0.76–1.27)
Globulin, Total: 2.8 g/dL (ref 1.5–4.5)
Glucose: 124 mg/dL — ABNORMAL HIGH (ref 70–99)
Potassium: 3.9 mmol/L (ref 3.5–5.2)
Sodium: 140 mmol/L (ref 134–144)
Total Protein: 7.7 g/dL (ref 6.0–8.5)
eGFR: 79 mL/min/{1.73_m2} (ref >59)

## 2023-03-28 LAB — CBC WITH DIFFERENTIAL/PLATELET
Basophils Absolute: 0.1 10*3/uL (ref 0.0–0.2)
Basos: 1 %
EOS (ABSOLUTE): 0.1 10*3/uL (ref 0.0–0.4)
Eos: 1 %
Hematocrit: 47.8 % (ref 37.5–51.0)
Hemoglobin: 15.7 g/dL (ref 13.0–17.7)
Immature Grans (Abs): 0 10*3/uL (ref 0.0–0.1)
Immature Granulocytes: 0 %
Lymphocytes Absolute: 4.1 10*3/uL — ABNORMAL HIGH (ref 0.7–3.1)
Lymphs: 46 %
MCH: 28.2 pg (ref 26.6–33.0)
MCHC: 32.8 g/dL (ref 31.5–35.7)
MCV: 86 fL (ref 79–97)
Monocytes Absolute: 0.6 10*3/uL (ref 0.1–0.9)
Monocytes: 6 %
Neutrophils Absolute: 4.1 10*3/uL (ref 1.4–7.0)
Neutrophils: 46 %
Platelets: 361 10*3/uL (ref 150–450)
RBC: 5.56 x10E6/uL (ref 4.14–5.80)
RDW: 12.1 % (ref 11.6–15.4)
WBC: 9 10*3/uL (ref 3.4–10.8)

## 2023-03-28 LAB — LIPID PANEL
Chol/HDL Ratio: 5.4 ratio — ABNORMAL HIGH (ref 0.0–5.0)
Cholesterol, Total: 226 mg/dL — ABNORMAL HIGH (ref 100–199)
HDL: 42 mg/dL (ref >39)
LDL Chol Calc (NIH): 158 mg/dL — ABNORMAL HIGH (ref 0–99)
Triglycerides: 145 mg/dL (ref 0–149)
VLDL Cholesterol Cal: 26 mg/dL (ref 5–40)

## 2023-03-28 LAB — TSH: TSH: 0.761 u[IU]/mL (ref 0.450–4.500)

## 2023-03-28 NOTE — Progress Notes (Signed)
Please, contact [facility] and check if we could add A1C

## 2023-03-30 NOTE — Progress Notes (Deleted)
  Established patient visit  Patient: TIMOHTY Peck   DOB: 1991-01-05   32 y.o. Male  MRN: [id number] Visit Date: 03/31/2023  Today's healthcare provider: Debera Lat, PA-C   No chief complaint on file.  Subjective    HPI  *** Discussed the use of AI scribe software for clinical note transcription with the patient, who gave verbal consent to proceed.  History of Present Illness               03/27/2023    2:05 PM  Depression screen PHQ 2/9  Decreased Interest 0  Down, Depressed, Hopeless 0  PHQ - 2 Score 0  Altered sleeping 1  Tired, decreased energy 1  Change in appetite 1  Feeling bad or failure about yourself  0  Trouble concentrating 1  Moving slowly or fidgety/restless 0  Suicidal thoughts 0  PHQ-9 Score 4  Difficult doing work/chores Not difficult at all      03/27/2023    2:06 PM  GAD 7 : Generalized Anxiety Score  Nervous, Anxious, on Edge 1  Control/stop worrying 1  Worry too much - different things 1  Trouble relaxing 1  Restless 0  Easily annoyed or irritable 1  Afraid - awful might happen 1  Total GAD 7 Score 6  Anxiety Difficulty Not difficult at all    Medications: Outpatient Medications Prior to Visit  Medication Sig   amLODipine (NORVASC) 10 MG tablet Take 1 tablet (10 mg total) by mouth daily.   Amphet-Dextroamphet 3-Bead ER 37.5 MG CP24 Take 1 capsule by mouth daily.   hydrochlorothiazide (HYDRODIURIL) 12.5 MG tablet Take 1 tablet (12.5 mg total) by mouth daily.   hydrOXYzine (ATARAX) 25 MG tablet Take 25 mg by mouth daily.   No facility-administered medications prior to visit.    Review of Systems Except see HPI   {Labs  Heme  Chem  Endocrine  Serology  Results Review (optional):23779}   Objective    There were no vitals taken for this visit. {Show previous vital signs (optional):23777}  Physical Exam   No results found for any visits on 03/31/23.  Assessment & Plan    *** Assessment and Plan              No  follow-ups on file.      [facility]

## 2023-03-31 ENCOUNTER — Ambulatory Visit: Payer: BC Managed Care – PPO | Admitting: Physician Assistant

## 2023-04-02 NOTE — Progress Notes (Signed)
Established patient visit  Patient: Jeffrey Peck   DOB: August 04, 1991   32 y.o. Male  MRN: [id number] Visit Date: 04/04/2023  Today's healthcare provider: Debera Lat, PA-C   Chief Complaint  Patient presents with   Hypertension   Subjective     Discussed the use of AI scribe software for clinical note transcription with the patient, who gave verbal consent to proceed.  History of Present Illness   The patient, with a history of hypertension and ADHD, presents with concerns about high blood pressure readings at home. The highest recorded reading was 192/123. The patient measures blood pressure at home, typically before getting out of bed and before taking blood pressure medication. The patient has been taking Amlodipine 10mg  and Hydrochlorothiazide 12.5mg  daily for hypertension. The patient also has ADHD and is on medication for it, which causes dry mouth and necessitates frequent water intake. The patient is awaiting a new prescription for a thyroid medication, which is currently on backlog at the pharmacy. The patient denies any chest pain, shortness of breath, rapid heart beating, or leg swelling. The patient also denies any back pain. The patient is active, trying to run for one to two hours on a trail when the weather permits. The patient is considering joining a [employer] for swimming exercises, despite concerns about hair care. The patient denies consuming a lot of salt and is willing to cut it out completely.           04/04/2023    8:19 AM 03/27/2023    2:05 PM  Depression screen PHQ 2/9  Decreased Interest 0 0  Down, Depressed, Hopeless 0 0  PHQ - 2 Score 0 0  Altered sleeping 0 1  Tired, decreased energy 0 1  Change in appetite 0 1  Feeling bad or failure about yourself  0 0  Trouble concentrating 0 1  Moving slowly or fidgety/restless 0 0  Suicidal thoughts 0 0  PHQ-9 Score 0 4  Difficult doing work/chores Not difficult at all Not difficult at all      03/27/2023    2:06  PM  GAD 7 : Generalized Anxiety Score  Nervous, Anxious, on Edge 1  Control/stop worrying 1  Worry too much - different things 1  Trouble relaxing 1  Restless 0  Easily annoyed or irritable 1  Afraid - awful might happen 1  Total GAD 7 Score 6  Anxiety Difficulty Not difficult at all    Medications: Outpatient Medications Prior to Visit  Medication Sig   amLODipine (NORVASC) 10 MG tablet Take 1 tablet (10 mg total) by mouth daily.   Amphet-Dextroamphet 3-Bead ER 37.5 MG CP24 Take 1 capsule by mouth daily.   hydrochlorothiazide (HYDRODIURIL) 12.5 MG tablet Take 1 tablet (12.5 mg total) by mouth daily.   hydrOXYzine (ATARAX) 25 MG tablet Take 25 mg by mouth daily.   No facility-administered medications prior to visit.    Review of Systems  All other systems reviewed and are negative.  Except see HPI        Objective    BP (!) 147/108 (BP Location: Right Arm)   Pulse 84   Ht 6\' 1"  (1.854 m)   Wt 201 lb 11.2 oz (91.5 kg)   SpO2 100%   BMI 26.61 kg/m      Physical Exam Vitals reviewed.  Constitutional:      General: He is not in acute distress.    Appearance: Normal appearance. He is not diaphoretic.  HENT:  Head: Normocephalic and atraumatic.  Eyes:     General: No scleral icterus.    Conjunctiva/sclera: Conjunctivae normal.  Cardiovascular:     Rate and Rhythm: Normal rate and regular rhythm.     Pulses: Normal pulses.     Heart sounds: Normal heart sounds. No murmur heard. Pulmonary:     Effort: Pulmonary effort is normal. No respiratory distress.     Breath sounds: Normal breath sounds. No wheezing or rhonchi.  Musculoskeletal:     Cervical back: Neck supple.     Right lower leg: No edema.     Left lower leg: No edema.  Lymphadenopathy:     Cervical: No cervical adenopathy.  Skin:    General: Skin is warm and dry.     Findings: No rash.  Neurological:     Mental Status: He is alert and oriented to person, place, and time. Mental status is at  baseline.  Psychiatric:        Mood and Affect: Mood normal.        Behavior: Behavior normal.      No results found for any visits on 04/04/23.  Assessment & Plan     Hypertension: Unstable. Elevated blood pressure readings at home, highest recorded at 192/123. Currently on Amlodipine 10mg  and Hydrochlorothiazide 12.5mg  daily. Discussed the importance of consistent and correct blood pressure measurement technique and the need for medication adherence. -Increase Hydrochlorothiazide to 12.5mg  twice daily. -Continue Amlodipine 10mg  daily. -Check blood pressure readings at home, record and bring to next appointment. -Return in 2 weeks with blood pressure readings or sooner if systolic blood pressure exceeds 160. In the setting of ADHD: Currently on medication, dosage not specified in the conversation. Discussed the potential impact of ADHD medication on blood pressure and the possibility of managing ADHD with counseling and medication. -Consider taking "old dose" of  ADHD medication  -Consider counseling for ADHD management.  General Health Maintenance: -Encouraged to increase physical activity, specifically swimming. -Advised to avoid salt intake. -Check A1C at next appointment in 2 weeks. -Advised to seek emergency care if experiencing symptoms such as chest pain, shortness of breath, rapid heart beating, pain radiating to jaw or arms, double vision, blurry vision, or headache.     Return in about 2 weeks (around 04/18/2023) for BP f/u.    The patient was advised to call back or seek an in-person evaluation if the symptoms worsen or if the condition fails to improve as anticipated.  I discussed the assessment and treatment plan with the patient. The patient was provided an opportunity to ask questions and all were answered. The patient agreed with the plan and demonstrated an understanding of the instructions.  I, Debera Lat, PA-C have reviewed all documentation for this visit. The  documentation on  04/04/23  for the exam, diagnosis, procedures, and orders are all accurate and complete.  Debera Lat, [facility], MMS [facility] [phone] (phone) [phone] (fax)  [facility]

## 2023-04-04 ENCOUNTER — Ambulatory Visit: Payer: BC Managed Care – PPO | Admitting: Physician Assistant

## 2023-04-04 ENCOUNTER — Encounter: Payer: Self-pay | Admitting: Physician Assistant

## 2023-04-04 VITALS — BP 147/108 | HR 84 | Ht 73.0 in | Wt 201.7 lb

## 2023-04-04 DIAGNOSIS — I10 Essential (primary) hypertension: Secondary | ICD-10-CM

## 2023-04-04 LAB — HGB A1C W/O EAG: Hgb A1c MFr Bld: 6.1 % — ABNORMAL HIGH (ref 4.8–5.6)

## 2023-04-04 LAB — SPECIMEN STATUS REPORT

## 2023-04-14 ENCOUNTER — Ambulatory Visit: Payer: BC Managed Care – PPO | Admitting: Family Medicine

## 2023-04-14 NOTE — Progress Notes (Deleted)
Established patient visit   Patient: Jeffrey Peck   DOB: 02/24/1991   32 y.o. Male  MRN: [id number] Visit Date: 04/14/2023  Today's healthcare provider: Sherlyn Hay, DO   No chief complaint on file.  Subjective    HPI  Hypertension, follow-up  BP Readings from Last 3 Encounters:  04/04/23 (!) 147/108  03/27/23 (!) 146/116  03/20/23 (!) 191/137   Wt Readings from Last 3 Encounters:  04/04/23 201 lb 11.2 oz (91.5 kg)  03/27/23 201 lb (91.2 kg)  03/20/23 206 lb (93.4 kg)     He was last seen for hypertension 10 days ago.  BP at that visit was 147/108. Management since that visit includes ***.  He reports {excellent/good/fair/poor:19665} compliance with treatment. He {is/is not:9024} having side effects. {document side effects if present:1} He is following a {diet:21022986} diet. He {is/is not:9024} exercising. He {does/does not:200015} smoke.  Use of agents associated with hypertension: {bp agents assoc with hypertension:511::"none"}.   Outside blood pressures are {***enter patient reported home BP readings, or 'not being checked':1}. Symptoms: {Yes/No:20286} chest pain {Yes/No:20286} chest pressure  {Yes/No:20286} palpitations {Yes/No:20286} syncope  {Yes/No:20286} dyspnea {Yes/No:20286} orthopnea  {Yes/No:20286} paroxysmal nocturnal dyspnea {Yes/No:20286} lower extremity edema   Pertinent labs Lab Results  Component Value Date   CHOL 226 (H) 03/27/2023   HDL 42 03/27/2023   LDLCALC 158 (H) 03/27/2023   TRIG 145 03/27/2023   CHOLHDL 5.4 (H) 03/27/2023   Lab Results  Component Value Date   NA 140 03/27/2023   K 3.9 03/27/2023   CREATININE 1.23 03/27/2023   EGFR 79 03/27/2023   GLUCOSE 124 (H) 03/27/2023   TSH 0.761 03/27/2023     The ASCVD Risk score (Arnett DK, et al., 2019) failed to calculate for the following reasons:   The 2019 ASCVD risk score is only valid for ages 76 to  31  ---------------------------------------------------------------------------------------------------     ***  Medications: Outpatient Medications Prior to Visit  Medication Sig   amLODipine (NORVASC) 10 MG tablet Take 1 tablet (10 mg total) by mouth daily.   Amphet-Dextroamphet 3-Bead ER 37.5 MG CP24 Take 1 capsule by mouth daily.   hydrochlorothiazide (HYDRODIURIL) 12.5 MG tablet Take 1 tablet (12.5 mg total) by mouth daily.   hydrOXYzine (ATARAX) 25 MG tablet Take 25 mg by mouth daily.   No facility-administered medications prior to visit.    Review of Systems  Respiratory: Negative.  Negative for cough, shortness of breath and wheezing.   Cardiovascular:  Negative for chest pain, palpitations and leg swelling.  Neurological:  Negative for weakness and headaches.    {Insert previous labs (optional):23779}  {See past labs  Heme  Chem  Endocrine  Serology  Results Review (optional):1}   Objective    There were no vitals taken for this visit. {Insert last BP/Wt (optional):23777}  {See vitals history (optional):1}  Physical Exam  ***  No results found for any visits on 04/14/23.  Assessment & Plan     ***  No follow-ups on file.      I discussed the assessment and treatment plan with the patient  The patient was provided an opportunity to ask questions and all were answered. The patient agreed with the plan and demonstrated an understanding of the instructions.   The patient was advised to call back or seek an in-person evaluation if the symptoms worsen or if the condition fails to improve as anticipated.    Sherlyn Hay, DO  [facility] [phone] (phone) [phone] (fax)  [facility]

## 2023-05-03 ENCOUNTER — Ambulatory Visit: Payer: BC Managed Care – PPO | Admitting: Family Medicine

## 2023-05-03 ENCOUNTER — Encounter: Payer: Self-pay | Admitting: Family Medicine

## 2023-05-03 VITALS — BP 134/93 | HR 98 | Resp 16 | Ht 71.0 in | Wt 201.0 lb

## 2023-05-03 DIAGNOSIS — F988 Other specified behavioral and emotional disorders with onset usually occurring in childhood and adolescence: Secondary | ICD-10-CM

## 2023-05-03 DIAGNOSIS — I10 Essential (primary) hypertension: Secondary | ICD-10-CM

## 2023-05-03 MED ORDER — VALSARTAN 40 MG PO TABS: 40.0000 mg | ORAL_TABLET | Freq: Every day | ORAL | 0 refills | Status: DC

## 2023-05-03 MED ORDER — HYDROCHLOROTHIAZIDE 25 MG PO TABS: 25.0000 mg | ORAL_TABLET | Freq: Every day | ORAL | 3 refills | Status: DC

## 2023-05-03 NOTE — Progress Notes (Signed)
Established patient visit   Patient: Jeffrey Peck   DOB: 1991-02-17   32 y.o. Male  MRN: [id number] Visit Date: 05/03/2023  Today's healthcare provider: Sherlyn Hay, DO   Chief Complaint  Patient presents with   Hypertension    Patient has been getting readings at home that are still in the triple digits for his diastolic.     Subjective    HPI HPI     Hypertension    Additional comments: Patient has been getting readings at home that are still in the triple digits for his diastolic.        Last edited by Adline Peals, CMA on 05/03/2023  3:59 PM.      Hypertension, follow-up  BP Readings from Last 3 Encounters:  05/03/23 (!) 134/93  04/04/23 (!) 147/108  03/27/23 (!) 146/116   Wt Readings from Last 3 Encounters:  05/03/23 201 lb (91.2 kg)  04/04/23 201 lb 11.2 oz (91.5 kg)  03/27/23 201 lb (91.2 kg)     He was last seen for hypertension 4 weeks ago.  BP at that visit was 147/108. Management since that visit includes amlodipine 10 mg dialy and hydrochlorothiazide 12.5 BID.  He reports excellent compliance with treatment. He is not having side effects.  He is following a Regular diet. He is exercising when he can; leaves really early in the morning for work and returns really late. He does smoke. Started smoking black'n'milds His cuff is slgihtly higher than clinic cuff.  Use of agents associated with hypertension: none.   Outside blood pressures are consistently significantly higher than the ones recorded here in the setting of. Symptoms: No chest pain No chest pressure  No palpitations No syncope  No dyspnea No orthopnea  No paroxysmal nocturnal dyspnea No lower extremity edema   Pertinent labs Lab Results  Component Value Date   CHOL 226 (H) 03/27/2023   HDL 42 03/27/2023   LDLCALC 158 (H) 03/27/2023   TRIG 145 03/27/2023   CHOLHDL 5.4 (H) 03/27/2023   Lab Results  Component Value Date   NA 140 03/27/2023   K 3.9 03/27/2023    CREATININE 1.23 03/27/2023   EGFR 79 03/27/2023   GLUCOSE 124 (H) 03/27/2023   TSH 0.761 03/27/2023     The ASCVD Risk score (Arnett DK, et al., 2019) failed to calculate for the following reasons:   The 2019 ASCVD risk score is only valid for ages 9 to 47  ---------------------------------------------------------------------------------------------------  Because on ADHD meds, strives to ensure he is eating three meals daily.   Patient endorses that he has recently been sexually active, primarily out of concern that his antihypertensives could cause erectile dysfunction based on what he had read on [employer]   Medications: Outpatient Medications Prior to Visit  Medication Sig Note   [DISCONTINUED] hydrochlorothiazide (HYDRODIURIL) 12.5 MG tablet Take 1 tablet (12.5 mg total) by mouth daily. 05/03/2023: tablet adjustment (12.5 bid -> 25 daily)   amLODipine (NORVASC) 10 MG tablet Take 1 tablet (10 mg total) by mouth daily.    Amphet-Dextroamphet 3-Bead ER 37.5 MG CP24 Take 1 capsule by mouth daily.    hydrOXYzine (ATARAX) 25 MG tablet Take 25 mg by mouth daily.    No facility-administered medications prior to visit.    Review of Systems  Eyes:  Negative for visual disturbance.  Respiratory: Negative.  Negative for cough, shortness of breath and wheezing.   Cardiovascular:  Negative for chest pain, palpitations and leg  swelling.  Neurological:  Negative for weakness and headaches.         Objective    BP (!) 134/93 (BP Location: Right Arm, Patient Position: Sitting, Cuff Size: Large)   Pulse 98   Resp 16   Ht 5\' 11"  (1.803 m)   Wt 201 lb (91.2 kg)   BMI 28.03 kg/m      Physical Exam Vitals and nursing note reviewed.  Constitutional:      General: He is not in acute distress.    Appearance: Normal appearance.  HENT:     Head: Normocephalic and atraumatic.  Eyes:     General: No scleral icterus.    Conjunctiva/sclera: Conjunctivae normal.  Cardiovascular:      Rate and Rhythm: Normal rate.  Pulmonary:     Effort: Pulmonary effort is normal.  Neurological:     Mental Status: He is alert and oriented to person, place, and time. Mental status is at baseline.  Psychiatric:        Mood and Affect: Mood normal.        Behavior: Behavior normal.      No results found for any visits on 05/03/23.  Assessment & Plan    Primary hypertension Assessment & Plan: Patient's blood pressure is still mildly elevated today.  His home blood pressures were consistently slightly higher than what was recorded here.  He did bring his cuff which showed his recorded pressure to be approximately 30 mmHg systolic and diastolic 7-8 mmHg higher than the clinic cuff.    - Given his blood pressure he remains above goal, I will go ahead and start him on valsartan 40 mg daily.  - I will be transitioning his hydrochlorothiazide from 12.5 mg twice daily to 25 mg once daily in order to facilitate adherence.  - Advised patient of signs and symptoms of hypotension to be aware of.  Advised him to check his blood pressure if he experiences the symptoms and to notify the clinic if it occurs.  Orders: -     Valsartan; Take 1 tablet (40 mg total) by mouth daily.  Dispense: 30 tablet; Refill: 0 -     hydroCHLOROthiazide; Take 1 tablet (25 mg total) by mouth daily.  Dispense: 90 tablet; Refill: 3  Attention deficit disorder, unspecified hyperactivity presence Assessment & Plan: Managed by [facility] in [place].  Will defer to their management.    Return in about 4 weeks (around 05/31/2023) for HTN, Screening.    Plan to reassess blood pressure and screen for sexually transmitted infection, per patient preference    I discussed the assessment and treatment plan with the patient  The patient was provided an opportunity to ask questions and all were answered. The patient agreed with the plan and demonstrated an understanding of the instructions.   The patient was  advised to call back or seek an in-person evaluation if the symptoms worsen or if the condition fails to improve as anticipated.    Sherlyn Hay, DO  [facility] [phone] (phone) [phone] (fax)  [facility]

## 2023-05-03 NOTE — Assessment & Plan Note (Signed)
Patient's blood pressure is still mildly elevated today.  His home blood pressures were consistently slightly higher than what was recorded here.  He did bring his cuff which showed his recorded pressure to be approximately 30 mmHg systolic and diastolic 7-8 mmHg higher than the clinic cuff.    - Given his blood pressure he remains above goal, I will go ahead and start him on valsartan 40 mg daily.  - I will be transitioning his hydrochlorothiazide from 12.5 mg twice daily to 25 mg once daily in order to facilitate adherence.  - Advised patient of signs and symptoms of hypotension to be aware of.  Advised him to check his blood pressure if he experiences the symptoms and to notify the clinic if it occurs.

## 2023-05-03 NOTE — Assessment & Plan Note (Signed)
Managed by [place] [facility] specialist in [place].  Will defer to their management.

## 2023-05-09 ENCOUNTER — Ambulatory Visit: Payer: Self-pay | Admitting: Physician Assistant

## 2023-05-26 ENCOUNTER — Encounter: Payer: Self-pay | Admitting: Pharmacist

## 2023-05-31 ENCOUNTER — Encounter: Payer: Self-pay | Admitting: Family Medicine

## 2023-05-31 ENCOUNTER — Other Ambulatory Visit (HOSPITAL_COMMUNITY)
Admission: RE | Admit: 2023-05-31 | Discharge: 2023-05-31 | Disposition: A | Discharge status: Home / Self Care | Payer: BC Managed Care – PPO | Source: Ambulatory Visit | Attending: Family Medicine | Admitting: Family Medicine

## 2023-05-31 ENCOUNTER — Ambulatory Visit: Payer: BC Managed Care – PPO | Admitting: Family Medicine

## 2023-05-31 ENCOUNTER — Other Ambulatory Visit: Payer: Self-pay | Admitting: Family Medicine

## 2023-05-31 VITALS — BP 133/93 | HR 75 | Ht 73.0 in | Wt 201.9 lb

## 2023-05-31 DIAGNOSIS — I10 Essential (primary) hypertension: Secondary | ICD-10-CM | POA: Diagnosis not present

## 2023-05-31 DIAGNOSIS — Z2821 Immunization not carried out because of patient refusal: Secondary | ICD-10-CM | POA: Diagnosis not present

## 2023-05-31 DIAGNOSIS — Z113 Encounter for screening for infections with a predominantly sexual mode of transmission: Secondary | ICD-10-CM | POA: Insufficient documentation

## 2023-05-31 DIAGNOSIS — K760 Fatty (change of) liver, not elsewhere classified: Secondary | ICD-10-CM | POA: Diagnosis not present

## 2023-05-31 MED ORDER — LOSARTAN POTASSIUM 25 MG PO TABS: 25.0000 mg | ORAL_TABLET | Freq: Every day | ORAL | 1 refills | Status: DC

## 2023-05-31 NOTE — Progress Notes (Signed)
Established patient visit   Patient: Jeffrey Peck   DOB: July 25, 1991   32 y.o. Male  MRN: [id number] Visit Date: 05/31/2023  Today's healthcare provider: Sherlyn Hay, DO   Chief Complaint  Patient presents with   Medical Management of Chronic Issues    4 week hypertension follow-up. Last seen 05/03/23. NO symptoms to report. No longer taking valsartan. Reports it made his skin feel like it was crawling. Patient last took the first week it was received.     Subjective    HPI  Hypertension, follow-up  BP Readings from Last 3 Encounters:  05/31/23 (!) 133/93  05/03/23 (!) 134/93  04/04/23 (!) 147/108   Wt Readings from Last 3 Encounters:  05/31/23 201 lb 14.4 oz (91.6 kg)  05/03/23 201 lb (91.2 kg)  04/04/23 201 lb 11.2 oz (91.5 kg)     He was last seen for hypertension 4 weeks ago.  BP at that visit was 134/93. Management since that visit includes valsartan 40 mg daily and hydrochlorothiazide 25 mg daily.  He reports fair compliance with treatment. Stopped valsartan. He  did  have side effects on the valsartan. Felt as though skin was crawling. He is following a Regular diet.  [place] sausage, eggs, oatmeal, poptarts, tries to [employer] for lunch/dinner, otherwise chick-fil[employer]a.   Tries to drink 2 L water daily He is exercising.  Started walking/running three times per week (~1 hour) after previous visit. Just got a gym membership to start working out daily. He does not smoke. Has long work schedule.  Leaves house at 4am for work and returns around [place].  Works Monday through Friday.   Use of agents associated with hypertension: none.   Outside blood pressures are not checked at this time due to it reading higher than clinic.  Symptoms: No chest pain No chest pressure  No palpitations No syncope  No dyspnea No orthopnea  No paroxysmal nocturnal dyspnea No lower extremity edema   Pertinent labs Lab Results  Component Value Date   CHOL 226 (H) 03/27/2023   HDL  42 03/27/2023   LDLCALC 158 (H) 03/27/2023   TRIG 145 03/27/2023   CHOLHDL 5.4 (H) 03/27/2023   Lab Results  Component Value Date   NA 140 03/27/2023   K 3.9 03/27/2023   CREATININE 1.23 03/27/2023   EGFR 79 03/27/2023   GLUCOSE 124 (H) 03/27/2023   TSH 0.761 03/27/2023     The ASCVD Risk score (Arnett DK, et al., 2019) failed to calculate for the following reasons:   The 2019 ASCVD risk score is only valid for ages 3 to 7  ---------------------------------------------------------------------------------------------------     Medications: Outpatient Medications Prior to Visit  Medication Sig   amLODipine (NORVASC) 10 MG tablet Take 1 tablet (10 mg total) by mouth daily.   Amphet-Dextroamphet 3-Bead ER 37.5 MG CP24 Take 1 capsule by mouth daily.   hydrOXYzine (ATARAX) 25 MG tablet Take 25 mg by mouth daily.   [DISCONTINUED] valsartan (DIOVAN) 40 MG tablet Take 1 tablet (40 mg total) by mouth daily.   hydrochlorothiazide (HYDRODIURIL) 25 MG tablet Take 1 tablet (25 mg total) by mouth daily. (Patient not taking: Reported on 05/31/2023)   No facility-administered medications prior to visit.    Review of Systems  Constitutional:  Negative for appetite change, chills and fever.  Respiratory:  Negative for chest tightness, shortness of breath and wheezing.   Cardiovascular:  Negative for chest pain and palpitations.  Gastrointestinal:  Negative for  abdominal pain, nausea and vomiting.  Genitourinary:  Positive for flank pain (very irregularly, resolves on its own). Negative for dysuria, penile discharge, penile pain, penile swelling, scrotal swelling and testicular pain.          Objective    BP (!) 133/93 (BP Location: Left Arm)   Pulse 75   Ht 6\' 1"  (1.854 m)   Wt 201 lb 14.4 oz (91.6 kg)   SpO2 100%   BMI 26.64 kg/m     Physical Exam Vitals and nursing note reviewed.  Constitutional:      General: He is not in acute distress.    Appearance: Normal  appearance.  HENT:     Head: Normocephalic and atraumatic.  Eyes:     General: No scleral icterus.    Conjunctiva/sclera: Conjunctivae normal.  Cardiovascular:     Rate and Rhythm: Normal rate.  Pulmonary:     Effort: Pulmonary effort is normal.  Neurological:     Mental Status: He is alert and oriented to person, place, and time. Mental status is at baseline.  Psychiatric:        Mood and Affect: Mood normal.        Behavior: Behavior normal.      No results found for any visits on 05/31/23.  Assessment & Plan    Primary hypertension Assessment & Plan: Continue amlodipine 10 mg daily Continue hydrochlorothiazide 25 mg daily Concern for resistant hypertension; rechecking BMP to reassess eGFR, which on last check places patient within range of chronic kidney disease stage 2.  - CT abdomen/pelvis with contrast 11/16/2021 - no noted abnormalities of kidneys except for those consistent with nephrolithiasis at the time.  No vascular abnormalities noted as well.  Discontinued valsartan due to patient intolerance/side effect. Discussed risks and benefits of ACE versus ARB, in particular potential for angioedema.     -  Will trial losartan.   Patient to notify clinic if he experiences side effects again and, if he does, we will discontinue losartan and send lisinopril 10 mg daily, #30 tablets, 0 refills and recheck his blood pressure on the next visit.  Orders: -     Losartan Potassium; Take 1 tablet (25 mg total) by mouth daily.  Dispense: 30 tablet; Refill: 1 -     Basic metabolic panel -     Aldosterone + renin activity w/ ratio  Screen for STD (sexually transmitted disease) Assessment & Plan: Patient had unprotected intercourse with a new partner prior to his previous visit and wanted to be checked for sexually transmitted infections at this visit.  Denies signs and symptoms.  Orders: -     HCV Ab w Reflex to Quant PCR -     HIV Antibody (routine testing w rflx) -     RPR -      Cervicovaginal ancillary only  Influenza vaccination declined by patient  Hepatic steatosis Assessment & Plan: No acute concerns.  Continue to monitor.     Return in about 4 weeks (around 06/28/2023) for HTN.      I discussed the assessment and treatment plan with the patient  The patient was provided an opportunity to ask questions and all were answered. The patient agreed with the plan and demonstrated an understanding of the instructions.   The patient was advised to call back or seek an in-person evaluation if the symptoms worsen or if the condition fails to improve as anticipated.  Total time was 50 minutes. That includes chart review before the  visit, the actual patient visit, and time spent on documentation after the visit.    Sherlyn Hay, DO  [facility] [phone] (phone) [phone] (fax)  [facility]

## 2023-05-31 NOTE — Assessment & Plan Note (Addendum)
Continue amlodipine 10 mg daily Continue hydrochlorothiazide 25 mg daily Concern for resistant/secondary hypertension; rechecking BMP to reassess eGFR, which on last check places patient within range of chronic kidney disease stage 2.  - CT abdomen/pelvis with contrast 11/16/2021 - no noted abnormalities of kidneys except for those consistent with nephrolithiasis at the time.  No vascular abnormalities noted as well.  - BP between left and right arm are within range of each other.  - Did discuss with the patient that his Adderall may be contributing to his blood pressure.  Requested that he obtain a new blood pressure cuff and try to check his blood pressures both on days he takes the Adderall and on days he does not for comparison.  Discontinued valsartan due to patient intolerance/side effect. Discussed risks and benefits of ACE versus ARB, in particular potential for angioedema.     -  Will trial losartan.   Patient to notify clinic if he experiences side effects again and, if he does, we will discontinue losartan and send lisinopril 10 mg daily, #30 tablets, 0 refills and recheck his blood pressure on the next visit.

## 2023-05-31 NOTE — Assessment & Plan Note (Signed)
No acute concerns.  Continue to monitor.

## 2023-05-31 NOTE — Assessment & Plan Note (Addendum)
Patient had unprotected intercourse with a new partner prior to his previous visit and wanted to be checked for sexually transmitted infections at this visit.  Denies signs and symptoms.

## 2023-06-01 LAB — CERVICOVAGINAL ANCILLARY ONLY
Chlamydia: NEGATIVE
Comment: NEGATIVE
Comment: NEGATIVE
Comment: NORMAL
Neisseria Gonorrhea: NEGATIVE
Trichomonas: NEGATIVE

## 2023-06-09 LAB — RPR, QUANT+TP ABS (REFLEX)
Rapid Plasma Reagin, Quant: 1:2 {titer} — ABNORMAL HIGH
T Pallidum Abs: REACTIVE — AB

## 2023-06-09 LAB — ALDOSTERONE + RENIN ACTIVITY W/ RATIO
Aldos/Renin Ratio: 6.7 (ref 0.0–30.0)
Aldosterone: 10.3 ng/dL (ref 0.0–30.0)
Renin Activity, Plasma: 1.526 ng/mL/hr (ref 0.167–5.380)

## 2023-06-09 LAB — BASIC METABOLIC PANEL
BUN/Creatinine Ratio: 8 — ABNORMAL LOW (ref 9–20)
BUN: 9 mg/dL (ref 6–20)
CO2: 25 mmol/L (ref 20–29)
Calcium: 10.3 mg/dL — ABNORMAL HIGH (ref 8.7–10.2)
Chloride: 98 mmol/L (ref 96–106)
Creatinine, Ser: 1.17 mg/dL (ref 0.76–1.27)
Glucose: 233 mg/dL — ABNORMAL HIGH (ref 70–99)
Potassium: 3.7 mmol/L (ref 3.5–5.2)
Sodium: 139 mmol/L (ref 134–144)
eGFR: 84 mL/min/{1.73_m2} (ref >59)

## 2023-06-09 LAB — RPR: RPR Ser Ql: REACTIVE — AB

## 2023-06-09 LAB — HIV ANTIBODY (ROUTINE TESTING W REFLEX): HIV Screen 4th Generation wRfx: NONREACTIVE

## 2023-06-09 LAB — HCV INTERPRETATION

## 2023-06-09 LAB — HCV AB W REFLEX TO QUANT PCR: HCV Ab: NONREACTIVE

## 2023-06-16 ENCOUNTER — Telehealth: Payer: Self-pay

## 2023-06-16 NOTE — Telephone Encounter (Signed)
Copied from CRM [id number]. Topic: General - Inquiry >> Jun 16, 2023 10:13 AM Runell Gess P wrote: Reason for CRM: pt called saying when he was in the [facility] they prescribed him Amlodipine 10 mg and he is out.  This is not on his list but he he needs a refill  [facility]  CB@  [phone]

## 2023-06-17 MED ORDER — AMLODIPINE BESYLATE 10 MG PO TABS
10.0000 mg | ORAL_TABLET | Freq: Every day | ORAL | 3 refills | Status: DC
Start: 2023-06-17 — End: 2024-07-19

## 2023-06-20 ENCOUNTER — Telehealth: Payer: Self-pay

## 2023-06-20 NOTE — Telephone Encounter (Signed)
Copied from CRM [id number]. Topic: General - Other >> Jun 20, 2023  1:22 PM Phill Myron wrote: Please call Mr Lacher regarding anxiety in the work place, he is asking for a letter to reduce his hours at work.

## 2023-06-21 ENCOUNTER — Ambulatory Visit: Payer: BC Managed Care – PPO | Admitting: Family Medicine

## 2023-06-21 NOTE — Telephone Encounter (Signed)
Patient has appointment scheduled 06/30/2023

## 2023-06-30 ENCOUNTER — Ambulatory Visit: Payer: BC Managed Care – PPO | Admitting: Family Medicine

## 2023-06-30 ENCOUNTER — Encounter: Payer: Self-pay | Admitting: Family Medicine

## 2023-06-30 VITALS — BP 118/83 | HR 81 | Temp 98.3°F | Resp 16 | Ht 73.0 in | Wt 200.2 lb

## 2023-06-30 DIAGNOSIS — Z566 Other physical and mental strain related to work: Secondary | ICD-10-CM

## 2023-06-30 DIAGNOSIS — R7303 Prediabetes: Secondary | ICD-10-CM | POA: Diagnosis not present

## 2023-06-30 DIAGNOSIS — E1122 Type 2 diabetes mellitus with diabetic chronic kidney disease: Secondary | ICD-10-CM | POA: Insufficient documentation

## 2023-06-30 DIAGNOSIS — F418 Other specified anxiety disorders: Secondary | ICD-10-CM | POA: Insufficient documentation

## 2023-06-30 DIAGNOSIS — A539 Syphilis, unspecified: Secondary | ICD-10-CM | POA: Insufficient documentation

## 2023-06-30 DIAGNOSIS — I10 Essential (primary) hypertension: Secondary | ICD-10-CM | POA: Diagnosis not present

## 2023-06-30 DIAGNOSIS — E119 Type 2 diabetes mellitus without complications: Secondary | ICD-10-CM | POA: Insufficient documentation

## 2023-06-30 MED ORDER — LOSARTAN POTASSIUM 25 MG PO TABS: 25.0000 mg | ORAL_TABLET | Freq: Every day | ORAL | 3 refills | Status: DC

## 2023-06-30 NOTE — Assessment & Plan Note (Signed)
>>  ASSESSMENT AND PLAN FOR TYPE 2 DIABETES, DIET CONTROLLED (HCC) WRITTEN ON 06/30/2023  3:08 PM BY Akosua Constantine N, DO  Patient is prediabetic based on A1c of 6.1 back in July 2024.  His most recent fasting glucose was 233.   - Discussed with him that this places him within diabetic range.  Discussed improving diet and exercise and then repeating his A1c in 2-3 months versus checking his A1c again today.  Patient preferred the former option.   - Gave patient information on low carbohydrate eating and referred him to nutrition and dietetics as noted below.  - Also encouraged patient to increase his exercise levels, which she does plan to do.

## 2023-06-30 NOTE — Patient Instructions (Addendum)
[facility], [facility] and [facility] are all counseling options.   [email or web address]

## 2023-06-30 NOTE — Progress Notes (Signed)
Established patient visit   Patient: Jeffrey Peck   DOB: October 20, 1990   32 y.o. Male  MRN: [id number] Visit Date: 06/30/2023  Today's healthcare provider: Sherlyn Hay, DO   Chief Complaint  Patient presents with   Follow-up    4 wk f/u HTN , experiencing work place anixety, been very difficult to go to work and uneasy    Subjective    HPI  Hypertension  - took last losartan today  - no side effects  Depression/anxiety concerns started a couple weeks ago.  - he reads webMD and becomes concerned  - thinks he's suffering from workplace anxiety  Working 12-13 hours a day five days a week.  - says "I don't know" regarding why he is working so many hours.  - supervisor for transportation  - doesn't have a schedule but needs to get the job done.  [facility]    Medications: Outpatient Medications Prior to Visit  Medication Sig   amLODipine (NORVASC) 10 MG tablet Take 1 tablet (10 mg total) by mouth daily.   Amphet-Dextroamphet 3-Bead ER 50 MG CP24 Take 1 capsule by mouth daily.   hydrochlorothiazide (HYDRODIURIL) 25 MG tablet Take 1 tablet (25 mg total) by mouth daily.   hydrOXYzine (ATARAX) 25 MG tablet Take 25 mg by mouth daily.   [DISCONTINUED] Amphet-Dextroamphet 3-Bead ER 37.5 MG CP24 Take 1 capsule by mouth daily.   [DISCONTINUED] losartan (COZAAR) 25 MG tablet Take 1 tablet (25 mg total) by mouth daily.   No facility-administered medications prior to visit.    Review of Systems  Constitutional:  Negative for chills and fever.  Eyes:  Negative for visual disturbance.  Respiratory: Negative.  Negative for cough, shortness of breath and wheezing.   Cardiovascular:  Negative for chest pain, palpitations and leg swelling.  Gastrointestinal:  Negative for abdominal pain, constipation, diarrhea, nausea and vomiting.  Endocrine: Negative for polydipsia and polyuria.  Neurological:  Negative for weakness and  headaches.        Objective    BP 118/83 (BP Location: Right Arm, Patient Position: Sitting, Cuff Size: Normal)   Pulse 81   Temp 98.3 F (36.8 C)   Resp 16   Ht 6\' 1"  (1.854 m)   Wt 200 lb 3.2 oz (90.8 kg)   SpO2 100%   BMI 26.41 kg/m     Physical Exam Vitals and nursing note reviewed.  Constitutional:      General: He is not in acute distress.    Appearance: Normal appearance.  HENT:     Head: Normocephalic and atraumatic.  Eyes:     General: No scleral icterus.    Conjunctiva/sclera: Conjunctivae normal.  Cardiovascular:     Rate and Rhythm: Normal rate.  Pulmonary:     Effort: Pulmonary effort is normal.  Neurological:     Mental Status: He is alert and oriented to person, place, and time. Mental status is at baseline.  Psychiatric:        Mood and Affect: Mood normal.        Behavior: Behavior normal.      No results found for any visits on 06/30/23.  Assessment & Plan    Primary hypertension Assessment & Plan: Well-controlled. Continue losartan 25 mg daily, amlodipine 10 mg daily, and hydrochlorothiazide 25 mg daily Refilled losartan  Orders: -     Losartan Potassium; Take 1 tablet (25 mg total) by mouth daily.  Dispense: 90 tablet; Refill: 3  Mixed anxiety and depressive disorder Assessment & Plan: Given patient's circumstances, went ahead and gave him a letter requesting that his work hours be restricted to 8/day.  Discussed with patient that this may ultimately require an FMLA form. Patient will be pursuing counseling but does not require a referral today.  Gave him information on potential counseling resources.   Other physical and mental strain related to work  Prediabetes Assessment & Plan: Patient is prediabetic based on A1c of 6.1 back in July 2024.  His most recent fasting glucose was 233.   - Discussed with him that this places him within diabetic range.  Discussed improving diet and exercise and then repeating his A1c in 2-3 months  versus checking his A1c again today.  Patient preferred the former option.   - Gave patient information on low carbohydrate eating and referred him to nutrition and dietetics as noted below.  - Also encouraged patient to increase his exercise levels, which she does plan to do.  Orders: -     Amb Referral to Nutrition and Diabetic Education  Syphilis in male Assessment & Plan: Patient has [facility] and receiving treatment for his syphilis.  He just had levels drawn yesterday.    Return in about 2 months (around 09/11/2023) for preDM.      I discussed the assessment and treatment plan with the patient  The patient was provided an opportunity to ask questions and all were answered. The patient agreed with the plan and demonstrated an understanding of the instructions.   The patient was advised to call back or seek an in-person evaluation if the symptoms worsen or if the condition fails to improve as anticipated.    Sherlyn Hay, DO  [facility] [phone] (phone) [phone] (fax)  [facility]

## 2023-06-30 NOTE — Assessment & Plan Note (Signed)
Given patient's circumstances, went ahead and gave him a letter requesting that his work hours be restricted to 8/day.  Discussed with patient that this may ultimately require an FMLA form. Patient will be pursuing counseling but does not require a referral today.  Gave him information on potential counseling resources.

## 2023-06-30 NOTE — Assessment & Plan Note (Signed)
Patient has been going to the health department and receiving treatment for his syphilis.  He just had levels drawn yesterday.

## 2023-06-30 NOTE — Assessment & Plan Note (Signed)
Patient is prediabetic based on A1c of 6.1 back in July 2024.  His most recent fasting glucose was 233.   - Discussed with him that this places him within diabetic range.  Discussed improving diet and exercise and then repeating his A1c in 2-3 months versus checking his A1c again today.  Patient preferred the former option.   - Gave patient information on low carbohydrate eating and referred him to nutrition and dietetics as noted below.  - Also encouraged patient to increase his exercise levels, which she does plan to do.

## 2023-06-30 NOTE — Assessment & Plan Note (Signed)
Well-controlled. Continue losartan 25 mg daily, amlodipine 10 mg daily, and hydrochlorothiazide 25 mg daily Refilled losartan

## 2023-07-19 ENCOUNTER — Telehealth: Payer: Self-pay | Admitting: Family Medicine

## 2023-07-19 NOTE — Telephone Encounter (Signed)
Received FMLA through fax and placed in Dr. Rexanne Mano box

## 2023-07-21 ENCOUNTER — Encounter: Payer: Self-pay | Admitting: Family Medicine

## 2023-07-25 DIAGNOSIS — Z0279 Encounter for issue of other medical certificate: Secondary | ICD-10-CM

## 2023-07-26 ENCOUNTER — Telehealth: Payer: Self-pay | Admitting: Family Medicine

## 2023-07-26 NOTE — Telephone Encounter (Signed)
Informed patient that FMLA paperwork was completed & faxed. A copy will be left up front for pick up.

## 2023-08-03 ENCOUNTER — Ambulatory Visit: Payer: BC Managed Care – PPO | Admitting: Dietician

## 2023-09-11 ENCOUNTER — Encounter: Payer: Self-pay | Admitting: Family Medicine

## 2023-09-11 ENCOUNTER — Ambulatory Visit: Payer: BC Managed Care – PPO | Admitting: Family Medicine

## 2023-09-11 VITALS — BP 116/76 | HR 81 | Temp 98.5°F | Ht 73.0 in | Wt 203.0 lb

## 2023-09-11 DIAGNOSIS — I10 Essential (primary) hypertension: Secondary | ICD-10-CM | POA: Diagnosis not present

## 2023-09-11 DIAGNOSIS — F418 Other specified anxiety disorders: Secondary | ICD-10-CM | POA: Diagnosis not present

## 2023-09-11 DIAGNOSIS — N182 Chronic kidney disease, stage 2 (mild): Secondary | ICD-10-CM

## 2023-09-11 DIAGNOSIS — E119 Type 2 diabetes mellitus without complications: Secondary | ICD-10-CM

## 2023-09-11 DIAGNOSIS — E1122 Type 2 diabetes mellitus with diabetic chronic kidney disease: Secondary | ICD-10-CM

## 2023-09-11 DIAGNOSIS — R739 Hyperglycemia, unspecified: Secondary | ICD-10-CM

## 2023-09-11 LAB — POCT GLYCOSYLATED HEMOGLOBIN (HGB A1C): Hemoglobin A1C: 6.8 % — AB (ref 4.0–5.6)

## 2023-09-11 MED ORDER — LANCETS MISC. MISC
1.0000 | Freq: Every day | 0 refills | Status: DC
Start: 2023-09-11 — End: 2024-07-19

## 2023-09-11 MED ORDER — BLOOD GLUCOSE MONITORING SUPPL DEVI
1.0000 | Freq: Every day | 0 refills | Status: DC
Start: 2023-09-11 — End: 2024-07-19

## 2023-09-11 MED ORDER — BLOOD GLUCOSE TEST VI STRP
1.0000 | ORAL_STRIP | Freq: Every day | 3 refills | Status: DC
Start: 2023-09-11 — End: 2024-07-19

## 2023-09-11 MED ORDER — ESCITALOPRAM OXALATE 10 MG PO TABS
10.0000 mg | ORAL_TABLET | Freq: Every day | ORAL | 1 refills | Status: DC
Start: 2023-09-11 — End: 2023-11-20

## 2023-09-11 MED ORDER — LANCET DEVICE MISC
1.0000 | Freq: Every day | 0 refills | Status: DC
Start: 2023-09-11 — End: 2024-07-19

## 2023-09-11 MED ORDER — FREESTYLE LIBRE 3 PLUS SENSOR MISC
0 refills | Status: DC
Start: 2023-09-11 — End: 2023-10-26

## 2023-09-11 NOTE — Progress Notes (Signed)
Established patient visit   Patient: Jeffrey Peck   DOB: [birth year]   32 y.o. Male  MRN: [id number] Visit Date: 09/11/2023  Today's healthcare provider: Sherlyn Hay, DO   Chief Complaint  Patient presents with   Hyperglycemia   Subjective    HPI Follow-up for prediabetes versus diabetes A1c 6.1 in July 2024 Fasting glucose 233 on 05/31/2023   The patient, with a history of work-related anxiety and suspected diabetes, presented for a follow-up consultation. The patient reported ongoing work-related anxiety, which has been affecting his daily routine, particularly in the mornings and on Sundays. The patient requested a revision of his FMLA paperwork to reflect his inability to work overtime due to this anxiety and due to being told the [facility] paperwork had to restrict his normally scheduled time, not just over time.  The patient also reported a recent change in diet, adopting a carnivore diet on the advice of a family member. This diet primarily consists of meat, eggs, and cheese, with the patient typically consuming his last meal at lunchtime. However, the patient acknowledged that this diet is not sustainable long-term and expressed a desire to avoid medication for diabetes if possible.  The patient also reported a lack of time for regular exercise, although he expressed a preference for weight lifting at a local gym. The patient indicated a plan to increase his exercise regimen in the new year.  The patient has been taking hydroxyzine for anxiety, which he reported makes him sleepy, necessitating its use at night. The patient expressed interest in exploring other medication options for anxiety management.  The patient's recent blood work indicated an elevated A1C level, suggesting the presence of diabetes. The patient expressed surprise and disappointment at this result, indicating a strong desire to manage his blood sugar levels through diet and exercise rather than  medication.     Medications: Outpatient Medications Prior to Visit  Medication Sig   amLODipine (NORVASC) 10 MG tablet Take 1 tablet (10 mg total) by mouth daily.   Amphet-Dextroamphet 3-Bead ER 50 MG CP24 Take 1 capsule by mouth daily.   hydrochlorothiazide (HYDRODIURIL) 25 MG tablet Take 1 tablet (25 mg total) by mouth daily.   hydrOXYzine (ATARAX) 25 MG tablet Take 25 mg by mouth daily.   losartan (COZAAR) 25 MG tablet Take 1 tablet (25 mg total) by mouth daily.   No facility-administered medications prior to visit.        Objective    BP 116/76 (BP Location: Right Arm, Patient Position: Sitting, Cuff Size: Normal)   Pulse 81   Temp 98.5 F (36.9 C) (Oral)   Ht 6\' 1"  (1.854 m)   Wt 203 lb (92.1 kg)   SpO2 100%   BMI 26.78 kg/m     Physical Exam Vitals and nursing note reviewed.  Constitutional:      General: He is not in acute distress.    Appearance: Normal appearance.  HENT:     Head: Normocephalic and atraumatic.  Eyes:     General: No scleral icterus.    Conjunctiva/sclera: Conjunctivae normal.  Cardiovascular:     Rate and Rhythm: Normal rate.  Pulmonary:     Effort: Pulmonary effort is normal.  Neurological:     Mental Status: He is alert and oriented to person, place, and time. Mental status is at baseline.  Psychiatric:        Mood and Affect: Mood normal.        Behavior: Behavior  normal.      Results for orders placed or performed in visit on 09/11/23  POCT glycosylated hemoglobin (Hb A1C)  Result Value Ref Range   Hemoglobin A1C 6.8 (A) 4.0 - 5.6 %   HbA1c POC (<> result, manual entry)     HbA1c, POC (prediabetic range)     HbA1c, POC (controlled diabetic range)      Assessment & Plan    Type 2 diabetes mellitus with stage 2 chronic kidney disease, without long-term current use of insulin (HCC) Assessment & Plan: Hemoglobin A1c is 6.8% today; with previous fasting blood glucose of 233 corresponding within approximate A1c just under 10.  Following a carnivore diet has helped reduce blood sugar levels but is not sustainable long-term. Prefers to avoid medication at this time. Discussed the importance of diet and exercise modifications. Explained signs of hyperglycemia. Provided a continuous glucose meter sample to monitor blood sugar levels and understand how foods affect blood sugar. - Encourage diet and exercise modifications - Send glucose monitoring kit to check blood sugar levels daily before eating - Follow up in mid-January to reassess blood sugar levels and discuss further management options  Orders: -     POCT glycosylated hemoglobin (Hb A1C) -     Blood Glucose Monitoring Suppl; 1 each by Does not apply route daily before breakfast. May substitute to any manufacturer covered by patient's insurance.  Dispense: 1 each; Refill: 0 -     Blood Glucose Test; 1 each by In Vitro route daily before breakfast. May substitute to any manufacturer covered by patient's insurance.  Dispense: 100 strip; Refill: 3 -     Lancet Device; 1 each by Does not apply route daily before breakfast. May substitute to any manufacturer covered by patient's insurance.  Dispense: 1 each; Refill: 0 -     Lancets Misc.; 1 each by Does not apply route daily before breakfast. May substitute to any manufacturer covered by patient's insurance.  Dispense: 100 each; Refill: 0 -     FreeStyle Libre 3 Plus Sensor; Change sensor every 15 days.  Dispense: 1 each; Refill: 0  Hyperglycemia -     POCT glycosylated hemoglobin (Hb A1C)  Mixed anxiety and depressive disorder Assessment & Plan: Reports ongoing work-related anxiety, particularly surrounding overtime and general work stress. Hydroxyzine has been used as needed but causes significant drowsiness. Discussed starting escitalopram (Lexapro) as a daily medication. Explained that it takes 4-6 weeks to build up and may require dosage adjustments. - Refilled out paperwork for patient's FMLA and gave him a copy  during the visit. - Prescribe escitalopram (Lexapro) daily - Follow up in 4-8 weeks to assess efficacy and side effects  Orders: -     Escitalopram Oxalate; Take 1 tablet (10 mg total) by mouth daily.  Dispense: 30 tablet; Refill: 1  Chronic kidney disease, stage 2, mildly decreased GFR Assessment & Plan: Chronic kidney disease stage 2 necessitates careful management of blood sugar levels to prevent further kidney damage. Emphasized the importance of blood sugar control in managing kidney disease. - Monitor kidney function regularly - Emphasize the importance of blood sugar control in managing kidney disease   Primary hypertension Assessment & Plan: Stable and well-controlled today. Continue hydrochlorothiazide 25 mg daily and losartan 25 mg daily.   General Health Maintenance Discussed the benefits of regular exercise for overall health and diabetes management. Discussed options for gym memberships and the benefits of different types of exercise. - Encourage regular exercise - Discuss options for gym  memberships and the benefits of different types of exercise   Return in about 4 weeks (around 10/09/2023) for DM, Anx.      I discussed the assessment and treatment plan with the patient  The patient was provided an opportunity to ask questions and all were answered. The patient agreed with the plan and demonstrated an understanding of the instructions.   The patient was advised to call back or seek an in-person evaluation if the symptoms worsen or if the condition fails to improve as anticipated.    Sherlyn Hay, DO  [facility] [phone] (phone) [phone] (fax)  [facility]

## 2023-09-15 DIAGNOSIS — N182 Chronic kidney disease, stage 2 (mild): Secondary | ICD-10-CM | POA: Insufficient documentation

## 2023-09-15 NOTE — Assessment & Plan Note (Addendum)
Hemoglobin A1c is 6.8% today; with previous fasting blood glucose of 233 corresponding within approximate A1c just under 10. Following a carnivore diet has helped reduce blood sugar levels but is not sustainable long-term. Prefers to avoid medication at this time. Discussed the importance of diet and exercise modifications. Explained signs of hyperglycemia. Provided a continuous glucose meter sample to monitor blood sugar levels and understand how foods affect blood sugar. - Encourage diet and exercise modifications - Send glucose monitoring kit to check blood sugar levels daily before eating - Follow up in mid-January to reassess blood sugar levels and discuss further management options

## 2023-09-15 NOTE — Assessment & Plan Note (Signed)
Stable and well-controlled today. Continue hydrochlorothiazide 25 mg daily and losartan 25 mg daily.

## 2023-09-15 NOTE — Assessment & Plan Note (Signed)
Chronic kidney disease stage 2 necessitates careful management of blood sugar levels to prevent further kidney damage. Emphasized the importance of blood sugar control in managing kidney disease. - Monitor kidney function regularly - Emphasize the importance of blood sugar control in managing kidney disease

## 2023-09-15 NOTE — Assessment & Plan Note (Addendum)
Reports ongoing work-related anxiety, particularly surrounding overtime and general work stress. Hydroxyzine has been used as needed but causes significant drowsiness. Discussed starting escitalopram (Lexapro) as a daily medication. Explained that it takes 4-6 weeks to build up and may require dosage adjustments. - Refilled out paperwork for patient's FMLA and gave him a copy during the visit. - Prescribe escitalopram (Lexapro) daily - Follow up in 4-8 weeks to assess efficacy and side effects

## 2023-09-15 NOTE — Assessment & Plan Note (Signed)
>>  ASSESSMENT AND PLAN FOR TYPE 2 DIABETES, DIET CONTROLLED (HCC) WRITTEN ON 09/15/2023 10:26 PM BY Jo Booze N, DO  Hemoglobin A1c is 6.8% today; with previous fasting blood glucose of 233 corresponding within approximate A1c just under 10. Following a carnivore diet has helped reduce blood sugar levels but is not sustainable long-term. Prefers to avoid medication at this time. Discussed the importance of diet and exercise modifications. Explained signs of hyperglycemia. Provided a continuous glucose meter sample to monitor blood sugar levels and understand how foods affect blood sugar. - Encourage diet and exercise modifications - Send glucose monitoring kit to check blood sugar levels daily before eating - Follow up in mid-January to reassess blood sugar levels and discuss further management options

## 2023-10-09 ENCOUNTER — Ambulatory Visit: Payer: BC Managed Care – PPO | Admitting: Family Medicine

## 2023-10-26 ENCOUNTER — Ambulatory Visit (INDEPENDENT_AMBULATORY_CARE_PROVIDER_SITE_OTHER): Payer: 59 | Admitting: Family Medicine

## 2023-10-26 ENCOUNTER — Other Ambulatory Visit: Payer: Self-pay | Admitting: Family Medicine

## 2023-10-26 ENCOUNTER — Encounter: Payer: Self-pay | Admitting: Family Medicine

## 2023-10-26 VITALS — BP 124/86 | HR 83 | Resp 16 | Ht 73.0 in | Wt 191.8 lb

## 2023-10-26 DIAGNOSIS — N182 Chronic kidney disease, stage 2 (mild): Secondary | ICD-10-CM

## 2023-10-26 DIAGNOSIS — E1169 Type 2 diabetes mellitus with other specified complication: Secondary | ICD-10-CM

## 2023-10-26 DIAGNOSIS — F4389 Other reactions to severe stress: Secondary | ICD-10-CM | POA: Diagnosis not present

## 2023-10-26 DIAGNOSIS — E1122 Type 2 diabetes mellitus with diabetic chronic kidney disease: Secondary | ICD-10-CM | POA: Diagnosis not present

## 2023-10-26 DIAGNOSIS — F419 Anxiety disorder, unspecified: Secondary | ICD-10-CM

## 2023-10-26 DIAGNOSIS — E119 Type 2 diabetes mellitus without complications: Secondary | ICD-10-CM

## 2023-10-26 LAB — POCT GLYCOSYLATED HEMOGLOBIN (HGB A1C)
Est. average glucose Bld gHb Est-mCnc: 137
Hemoglobin A1C: 6.4 % — AB (ref 4.0–5.6)

## 2023-10-26 MED ORDER — FREESTYLE LIBRE 3 PLUS SENSOR MISC: 11 refills | Status: DC

## 2023-10-26 NOTE — Progress Notes (Signed)
 Established patient visit   Patient: Jeffrey Peck   DOB: [birth year]   33 y.o. Male  MRN: [id number] Visit Date: 10/26/2023  Today's healthcare provider: LAURAINE LOISE BUOY, DO   Chief Complaint  Patient presents with   Medical Management of Chronic Issues    DM   Subjective    HPI [name] is a 33 year old male who presents for diabetes management and monitoring.  He has been managing his diabetes through diet and exercise, aiming to avoid medication. His A1c is 6.4%, which is in the prediabetic range. He is intentional about not eating out, cooking at home, and meal prepping on Sundays, although a hectic week has led to more eating out. He tries not to eat after 6 PM and continues to exercise regularly. He uses a [employer] 3 for blood sugar monitoring, which he finds convenient, but insurance does not cover it unless he is on insulin. No numbness or tingling in his feet and no issues with sensation during a foot exam.  He experiences stress and anxiety, attributing it to feeling overwhelmed and burnt out, rather than depression. He is open to virtual counseling sessions to develop coping mechanisms.  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     Medications: Outpatient Medications Prior to Visit  Medication Sig   amLODipine  (NORVASC ) 10 MG tablet Take 1 tablet (10 mg total) by mouth daily.   Amphet-Dextroamphet 3-Bead ER 50 MG CP24 Take 1 capsule by mouth daily.   Blood Glucose Monitoring Suppl DEVI 1 each by Does not apply route daily before breakfast. May substitute to any manufacturer covered by patient's insurance.   escitalopram  (LEXAPRO ) 10 MG tablet Take 1 tablet (10 mg total) by mouth daily.   Glucose Blood (BLOOD GLUCOSE TEST STRIPS) STRP 1 each by In Vitro route daily before breakfast. May substitute to any manufacturer covered by patient's insurance.   hydrochlorothiazide  (HYDRODIURIL ) 25 MG tablet Take 1 tablet (25 mg total) by  mouth daily.   hydrOXYzine (ATARAX) 25 MG tablet Take 25 mg by mouth daily.   Lancet Device MISC 1 each by Does not apply route daily before breakfast. May substitute to any manufacturer covered by patient's insurance.   losartan  (COZAAR ) 25 MG tablet Take 1 tablet (25 mg total) by mouth daily.   [DISCONTINUED] Continuous Glucose Sensor (FREESTYLE LIBRE 3 PLUS SENSOR) MISC Change sensor every 15 days.   Lancets Misc. MISC 1 each by Does not apply route daily before breakfast. May substitute to any manufacturer covered by patient's insurance.   No facility-administered medications prior to visit.        Objective    BP 124/86 (BP Location: Left Arm, Patient Position: Sitting, Cuff Size: Normal)   Pulse 83   Resp 16   Ht 6' 1 (1.854 m)   Wt 191 lb 12.8 oz (87 kg)   BMI 25.30 kg/m     Physical Exam Vitals and nursing note reviewed.  Constitutional:      General: He is not in acute distress.    Appearance: Normal appearance.  HENT:     Head: Normocephalic and atraumatic.  Eyes:     General: No scleral icterus.    Conjunctiva/sclera: Conjunctivae normal.  Cardiovascular:     Rate and Rhythm: Normal rate.     Pulses:          Dorsalis pedis pulses are 2+ on the right side and 2+  on the left side.       Posterior tibial pulses are 2+ on the right side and 2+ on the left side.  Pulmonary:     Effort: Pulmonary effort is normal.  Musculoskeletal:     Right foot: Normal range of motion. No deformity, bunion, Charcot foot, foot drop or prominent metatarsal heads.     Left foot: Normal range of motion. No deformity, bunion, Charcot foot, foot drop or prominent metatarsal heads.  Feet:     Right foot:     Protective Sensation: 10 sites tested.  10 sites sensed.     Skin integrity: No ulcer, blister, skin breakdown, erythema, warmth, callus, dry skin or fissure.     Toenail Condition: Right toenails are normal.     Left foot:     Protective Sensation: 10 sites tested.  10 sites  sensed.     Skin integrity: No ulcer, blister, skin breakdown, erythema, warmth, callus, dry skin or fissure.     Toenail Condition: Left toenails are normal.  Neurological:     Mental Status: He is alert and oriented to person, place, and time. Mental status is at baseline.  Psychiatric:        Mood and Affect: Mood normal.        Behavior: Behavior normal.      Results for orders placed or performed in visit on 10/26/23  POCT glycosylated hemoglobin (Hb A1C)  Result Value Ref Range   Hemoglobin A1C 6.4 (A) 4.0 - 5.6 %   Est. average glucose Bld gHb Est-mCnc 137     Assessment & Plan    Type 2 diabetes, diet controlled (HCC) -     POCT glycosylated hemoglobin (Hb A1C)  Type 2 diabetes mellitus with stage 2 chronic kidney disease, without long-term current use of insulin (HCC) Assessment & Plan: A1c is 6.4%, indicating prediabetes. He manages his condition through diet and exercise, avoiding medication. He prepares meals at home, avoids eating after 6 PM. He briefly used a [employer] 3 for blood glucose monitoring and has not checked since due to an aversion to needles. Insurance coverage of the [place] 3 is uncertain but will send today at patient request. Discussed alternative CGMs like Dexcom G7, Dexcom One and Stelo, the lattermost of which may be more affordable and does not require a prescription. - Continue diet and exercise regimen. - Send urine sample to check kidney function. - [facility] [employer] [facility] [address] and [place]. - Perform annual foot exam. - Refer for annual diabetic eye exam.  Orders: -     Microalbumin / creatinine urine ratio  Reaction to chronic stress Assessment & Plan: Reports feeling overwhelmed and burnt out, with difficulty getting out of bed. Denies depression but acknowledges stress and anxiety. Has not been in counseling. Discussed the importance of counseling for developing coping mechanisms and managing  stress. - Refer to a counselor for virtual sessions. - Reassess stress levels in three months.  Orders: -     Ambulatory referral to Psychology  Anxiety -     Ambulatory referral to Psychology  General Health Maintenance Due for tetanus and pneumococcal vaccines. Works in a school and may be up to date on vaccinations. Discussed the importance of these vaccines, especially given his increased risk due to diabetes. - Check vaccination records for Tdap and update if necessary. - Discuss pneumococcal vaccine and provide information if interested.   Return in about 3 months (around 01/23/2024).  I discussed the assessment and treatment plan with the patient  The patient was provided an opportunity to ask questions and all were answered. The patient agreed with the plan and demonstrated an understanding of the instructions.   The patient was advised to call back or seek an in-person evaluation if the symptoms worsen or if the condition fails to improve as anticipated.    LAURAINE LOISE BUOY, DO  [facility] [phone] (phone) [phone] (fax)  [facility]

## 2023-10-27 ENCOUNTER — Telehealth: Payer: Self-pay | Admitting: Family Medicine

## 2023-10-27 ENCOUNTER — Other Ambulatory Visit: Payer: Self-pay | Admitting: Family Medicine

## 2023-10-27 DIAGNOSIS — E119 Type 2 diabetes mellitus without complications: Secondary | ICD-10-CM

## 2023-10-27 MED ORDER — DEXCOM G7 SENSOR MISC: 11 refills | Status: DC

## 2023-10-27 NOTE — Telephone Encounter (Signed)
 Received a fax from covermymeds for Dexcom G7 Sensor  Key: [id number]

## 2023-10-31 ENCOUNTER — Telehealth: Payer: Self-pay | Admitting: Family Medicine

## 2023-10-31 NOTE — Telephone Encounter (Signed)
Covermymeds is requesting prior authorization Key: [id number] Dexcom G7 Sensor

## 2023-11-01 ENCOUNTER — Telehealth: Payer: Self-pay

## 2023-11-01 NOTE — Telephone Encounter (Signed)
Pharmacy Patient Advocate Encounter   Received notification from Pt Calls Messages that prior authorization for Dexcom G7 Sensor is required/requested.   Insurance verification completed.   The patient is insured through [facility] .   Per test claim: PA required; PA submitted to above mentioned insurance via CoverMyMeds Key/confirmation #/EOC [id number] Status is pending

## 2023-11-01 NOTE — Telephone Encounter (Signed)
Pharmacy Patient Advocate Encounter  Received notification from [facility] that Prior Authorization for Dexcom G7 Sensor  has been DENIED.  Full denial letter will be uploaded to the media tab. See denial reason below.   PA #/Case ID/Reference #: [id number]

## 2023-11-02 ENCOUNTER — Encounter: Payer: Self-pay | Admitting: Family Medicine

## 2023-11-02 DIAGNOSIS — F4389 Other reactions to severe stress: Secondary | ICD-10-CM | POA: Insufficient documentation

## 2023-11-02 DIAGNOSIS — F419 Anxiety disorder, unspecified: Secondary | ICD-10-CM | POA: Insufficient documentation

## 2023-11-02 NOTE — Assessment & Plan Note (Signed)
A1c is 6.4%, indicating prediabetes. He manages his condition through diet and exercise, avoiding medication. He prepares meals at home, avoids eating after 6 PM. He briefly used a [employer] 3 for blood glucose monitoring and has not checked since due to an aversion to needles. Insurance coverage of the [place] 3 is uncertain but will send today at patient request. Discussed alternative CGMs like Dexcom G7, Dexcom One and Stelo, the lattermost of which may be more affordable and does not require a prescription. - Continue diet and exercise regimen. - Send urine sample to check kidney function. - [facility] [employer] [facility] [address] and [place]. - Perform annual foot exam. - Refer for annual diabetic eye exam.

## 2023-11-02 NOTE — Assessment & Plan Note (Signed)
Reports feeling overwhelmed and burnt out, with difficulty getting out of bed. Denies depression but acknowledges stress and anxiety. Has not been in counseling. Discussed the importance of counseling for developing coping mechanisms and managing stress. - Refer to a counselor for virtual sessions. - Reassess stress levels in three months.

## 2023-11-03 DIAGNOSIS — Z0279 Encounter for issue of other medical certificate: Secondary | ICD-10-CM

## 2023-11-06 ENCOUNTER — Telehealth: Payer: Self-pay | Admitting: Family Medicine

## 2023-11-06 NOTE — Telephone Encounter (Signed)
 Patient was informed that FMLA paperwork completed and faxed.

## 2023-11-18 ENCOUNTER — Other Ambulatory Visit: Payer: Self-pay | Admitting: Family Medicine

## 2023-11-18 DIAGNOSIS — F418 Other specified anxiety disorders: Secondary | ICD-10-CM

## 2023-11-20 NOTE — Telephone Encounter (Signed)
 Requested Prescriptions  Pending Prescriptions Disp Refills   escitalopram (LEXAPRO) 10 MG tablet [Pharmacy Med Name: ESCITALOPRAM 10 MG TABLET] 90 tablet 0    Sig: TAKE 1 TABLET BY MOUTH EVERY DAY     Psychiatry:  Antidepressants - SSRI Passed - 11/20/2023  1:41 PM      Passed - Completed PHQ-2 or PHQ-9 in the last 360 days      Passed - Valid encounter within last 6 months    Recent Outpatient Visits           2 months ago Type 2 diabetes mellitus with stage 2 chronic kidney disease, without long-term current use of insulin (HCC)   [place] [facility] [place], Monico Blitz, DO   4 months ago Primary hypertension   [place] [facility] [place], Monico Blitz, DO   5 months ago Primary hypertension   [place] [facility] [place], Monico Blitz, DO   6 months ago Primary hypertension   [place] [facility] [place], Monico Blitz, DO   7 months ago Primary hypertension   [place] [facility] [place], [place], PA-C       Future Appointments             In 2 months [place], Monico Blitz, DO [place] [facility]

## 2024-01-24 ENCOUNTER — Ambulatory Visit: Payer: 59 | Admitting: Family Medicine

## 2024-03-07 ENCOUNTER — Other Ambulatory Visit: Payer: Self-pay | Admitting: Family Medicine

## 2024-03-07 DIAGNOSIS — F418 Other specified anxiety disorders: Secondary | ICD-10-CM

## 2024-03-07 DIAGNOSIS — I10 Essential (primary) hypertension: Secondary | ICD-10-CM

## 2024-03-08 NOTE — Telephone Encounter (Signed)
 LOV 10/26/23 NOV n/a LRF 11/20/23 Q:90 R:0 (Lexapro )

## 2024-07-19 ENCOUNTER — Ambulatory Visit: Admitting: Family Medicine

## 2024-07-19 ENCOUNTER — Encounter: Payer: Self-pay | Admitting: Family Medicine

## 2024-07-19 VITALS — BP 147/115 | HR 78 | Temp 97.6°F | Ht 73.0 in | Wt 204.3 lb

## 2024-07-19 DIAGNOSIS — F418 Other specified anxiety disorders: Secondary | ICD-10-CM | POA: Diagnosis not present

## 2024-07-19 DIAGNOSIS — F4389 Other reactions to severe stress: Secondary | ICD-10-CM | POA: Diagnosis not present

## 2024-07-19 DIAGNOSIS — E1122 Type 2 diabetes mellitus with diabetic chronic kidney disease: Secondary | ICD-10-CM | POA: Diagnosis not present

## 2024-07-19 DIAGNOSIS — I10 Essential (primary) hypertension: Secondary | ICD-10-CM

## 2024-07-19 DIAGNOSIS — N182 Chronic kidney disease, stage 2 (mild): Secondary | ICD-10-CM

## 2024-07-19 MED ORDER — LOSARTAN POTASSIUM 25 MG PO TABS: 25.0000 mg | ORAL_TABLET | Freq: Every day | ORAL | 0 refills | Status: DC

## 2024-07-19 MED ORDER — HYDROCHLOROTHIAZIDE 12.5 MG PO TABS: 12.5000 mg | ORAL_TABLET | Freq: Every day | ORAL | 3 refills | Status: DC

## 2024-07-19 NOTE — Progress Notes (Signed)
 Established patient visit   Patient: Jeffrey Peck   DOB: 1991-02-19   33 y.o. Male  MRN: [id number] Visit Date: 07/19/2024  Today's healthcare provider: LAURAINE LOISE BUOY, DO   Chief Complaint  Patient presents with   Medical Management of Chronic Issues    Patient is here to follow u on Diabetes and Hypertension, he missed his last appointment.  Expresses no concerns.   Subjective    HPI Jeffrey Peck is a 33 year old male with hypertension who presents for medication management and follow-up.  He has a history of hypertension and is currently experiencing elevated blood pressure. He has not been monitoring his blood pressure at home and has missed previous appointments due to a busy schedule. He experiences headaches, chest pain, and shortness of breath, which he attributes to stress. He has stopped taking all medications except for Adderall, which is managed by his behavioral health provider.  He has a history of anxiety and depression and has discontinued escitalopram . He attempts to manage his depression by staying busy. He is experiencing stress and anxiety related to his job and is seeking new employment to mitigate his symptoms. He was previously in counseling but faced issues with insurance coverage.  He has not been checking his blood sugars and is interested in obtaining a continuous glucose monitor.  He is not interested in receiving a flu vaccine at this time.      Medications: Outpatient Medications Prior to Visit  Medication Sig   Amphet-Dextroamphet 3-Bead ER 50 MG CP24 Take 1 capsule by mouth daily.   Continuous Glucose Sensor (DEXCOM G7 SENSOR) MISC Apply one sensor every ten (10) days for glucose monitoring (Patient not taking: Reported on 07/19/2024)   [DISCONTINUED] amLODipine  (NORVASC ) 10 MG tablet Take 1 tablet (10 mg total) by mouth daily.   [DISCONTINUED] Blood Glucose Monitoring Suppl DEVI 1 each by Does not apply route daily before breakfast.  May substitute to any manufacturer covered by patient's insurance.   [DISCONTINUED] escitalopram  (LEXAPRO ) 10 MG tablet TAKE 1 TABLET BY MOUTH EVERY DAY   [DISCONTINUED] Glucose Blood (BLOOD GLUCOSE TEST STRIPS) STRP 1 each by In Vitro route daily before breakfast. May substitute to any manufacturer covered by patient's insurance.   [DISCONTINUED] hydrochlorothiazide  (HYDRODIURIL ) 25 MG tablet TAKE 1 TABLET (25 MG TOTAL) BY MOUTH DAILY.   [DISCONTINUED] hydrOXYzine (ATARAX) 25 MG tablet Take 25 mg by mouth daily.   [DISCONTINUED] Lancet Device MISC 1 each by Does not apply route daily before breakfast. May substitute to any manufacturer covered by patient's insurance.   [DISCONTINUED] Lancets Misc. MISC 1 each by Does not apply route daily before breakfast. May substitute to any manufacturer covered by patient's insurance.   [DISCONTINUED] losartan  (COZAAR ) 25 MG tablet Take 1 tablet (25 mg total) by mouth daily.   No facility-administered medications prior to visit.        Objective    BP (!) 147/115 (BP Location: Left Arm, Patient Position: Sitting, Cuff Size: Large)   Pulse 78   Temp 97.6 F (36.4 C) (Oral)   Ht 6' 1 (1.854 m)   Wt 204 lb 4.8 oz (92.7 kg)   SpO2 100%   BMI 26.95 kg/m     Physical Exam Vitals and nursing note reviewed.  Constitutional:      General: He is not in acute distress.    Appearance: Normal appearance.  HENT:     Head: Normocephalic and atraumatic.  Eyes:  General: No scleral icterus.    Conjunctiva/sclera: Conjunctivae normal.  Cardiovascular:     Rate and Rhythm: Normal rate.  Pulmonary:     Effort: Pulmonary effort is normal.  Neurological:     Mental Status: He is alert and oriented to person, place, and time. Mental status is at baseline.  Psychiatric:        Mood and Affect: Mood normal.        Behavior: Behavior normal.      Results for orders placed or performed in visit on 07/19/24  Microalbumin / creatinine urine ratio   Result Value Ref Range   Creatinine, Urine 357.5 Not Estab. mg/dL   Microalbumin, Urine 39.6 Not Estab. ug/mL   Microalb/Creat Ratio 17 0 - 29 mg/g creat  Comprehensive metabolic panel with GFR  Result Value Ref Range   Glucose 229 (H) 70 - 99 mg/dL   BUN 14 6 - 20 mg/dL   Creatinine, Ser 8.74 0.76 - 1.27 mg/dL   eGFR 77 >40 fO/fpw/8.26   BUN/Creatinine Ratio 11 9 - 20   Sodium 141 134 - 144 mmol/L   Potassium 4.0 3.5 - 5.2 mmol/L   Chloride 100 96 - 106 mmol/L   CO2 24 20 - 29 mmol/L   Calcium 10.2 8.7 - 10.2 mg/dL   Total Protein 7.6 6.0 - 8.5 g/dL   Albumin 5.0 4.1 - 5.1 g/dL   Globulin, Total 2.6 1.5 - 4.5 g/dL   Bilirubin Total 0.7 0.0 - 1.2 mg/dL   Alkaline Phosphatase 124 (H) 47 - 123 IU/L   AST 17 0 - 40 IU/L   ALT 33 0 - 44 IU/L  Hemoglobin A1c  Result Value Ref Range   Hgb A1c MFr Bld 8.8 (H) 4.8 - 5.6 %   Est. average glucose Bld gHb Est-mCnc 206 mg/dL  Lipid panel  Result Value Ref Range   Cholesterol, Total 239 (H) 100 - 199 mg/dL   Triglycerides 96 0 - 149 mg/dL   HDL 42 >60 mg/dL   VLDL Cholesterol Cal 17 5 - 40 mg/dL   LDL Chol Calc (NIH) 819 (H) 0 - 99 mg/dL   Chol/HDL Ratio 5.7 (H) 0.0 - 5.0 ratio    Assessment & Plan    Type 2 diabetes mellitus with stage 2 chronic kidney disease, without long-term current use of insulin (HCC) -     Microalbumin / creatinine urine ratio -     Hemoglobin A1c  Primary hypertension -     Comprehensive metabolic panel with GFR -     Lipid panel -     Losartan  Potassium; Take 1 tablet (25 mg total) by mouth daily.  Dispense: 90 tablet; Refill: 0 -     hydroCHLOROthiazide ; Take 1 tablet (12.5 mg total) by mouth daily.  Dispense: 90 tablet; Refill: 3  Mixed anxiety and depressive disorder  Reaction to chronic stress     Type 2 diabetes mellitus with stage 2 chronic kidney disease, without long-term current use of insulin Diabetes management suboptimal without blood glucose monitoring. Discussed continuous glucose  monitoring systems, with over-the-counter Freestyle [place] and Dexcom Stelo having similar costs associated. Concern for diabetic kidney disease progression. - Discuss options for continuous glucose monitoring systems, including Dexcom Stelo and Freestyle Rio, and their costs.  Discussed that insurance typically does not cover CGM's, such as the Dexcom G7 and freestyle libre 3 plus, for patients who are not insulin-dependent. - Order blood work to check A1c levels. - Order uACR today.  Primary hypertension Hypertension uncontrolled due to medication non-adherence and lack of monitoring. Elevated risk of end-organ damage, especially renal complications. - Restart losartan  25 mg daily. - Restart hydrochlorothiazide  at a lower dose -12.5 mg daily. - Instruct to obtain a blood pressure cuff and monitor blood pressure daily starting one week after restarting medication. - Schedule follow-up to recheck blood pressure and adjust medication as needed.  Mixed anxiety and depressive disorder; reaction to chronic stress Depression managed without escitalopram . 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 Previous counseling interrupted due to insurance issues.  Patient prefers to continue without escitalopram  for the time being.  Recommended patient restart counseling.  Patient will be dropping off FMLA paperwork to continue intermittent leave to facilitate better management of his anxiety and stress.  General Health Maintenance Declined flu vaccine. Eye exam recommended for routine health maintenance. - Recommend scheduling an eye exam.    Return in about 25 days (around 08/13/2024) for HTN, DM.      I discussed the assessment and treatment plan with the patient  The patient was provided an opportunity to ask questions and all were answered. The patient agreed with the plan and demonstrated an understanding of the instructions.   The patient was advised to call back or seek an in-person evaluation if the symptoms  worsen or if the condition fails to improve as anticipated.    LAURAINE LOISE BUOY, DO  [facility] [phone] (phone) [phone] (fax)  [facility]

## 2024-07-19 NOTE — Patient Instructions (Addendum)
 Over the counter continuous glucose meters: Dexcom [facility]   If, after two weeks of taking the losartan  25mg  tablets and the hydrochlorothiazide  12.5 tablets, your blood pressure remains greater than 130/80, increase your losartan  to two tablets daily (for a total of 50 mg). Continue your hydrochlorothiazide  unchanged.  _________________________________________________________  Check your blood pressure once daily, and any time you have concerning symptoms like headache, chest pain, dizziness, shortness of breath, or vision changes.   Our goal is less than 130/80.  To appropriately check your blood pressure, make sure you do the following:  1) Avoid caffeine, exercise, or tobacco products for 30 minutes before checking. Empty your bladder. 2) Sit with your back supported in a flat-backed chair. Rest your arm on something flat (arm of the chair, table, etc). 3) Sit still with your feet flat on the floor, resting, for at least 5 minutes.  4) Check your blood pressure. Take 1-2 readings.  5) Write down these readings and bring with you to any provider appointments.  Bring your home blood pressure machine with you to a provider's office for accuracy comparison at least once a year.   Make sure you take your blood pressure medications before you come to any office visit, even if you were asked to fast for labs.

## 2024-07-20 LAB — COMPREHENSIVE METABOLIC PANEL WITH GFR
ALT: 33 IU/L (ref 0–44)
AST: 17 IU/L (ref 0–40)
Albumin: 5 g/dL (ref 4.1–5.1)
Alkaline Phosphatase: 124 IU/L — ABNORMAL HIGH (ref 47–123)
BUN/Creatinine Ratio: 11 (ref 9–20)
BUN: 14 mg/dL (ref 6–20)
Bilirubin Total: 0.7 mg/dL (ref 0.0–1.2)
CO2: 24 mmol/L (ref 20–29)
Calcium: 10.2 mg/dL (ref 8.7–10.2)
Chloride: 100 mmol/L (ref 96–106)
Creatinine, Ser: 1.25 mg/dL (ref 0.76–1.27)
Globulin, Total: 2.6 g/dL (ref 1.5–4.5)
Glucose: 229 mg/dL — ABNORMAL HIGH (ref 70–99)
Potassium: 4 mmol/L (ref 3.5–5.2)
Sodium: 141 mmol/L (ref 134–144)
Total Protein: 7.6 g/dL (ref 6.0–8.5)
eGFR: 77 mL/min/1.73 (ref >59)

## 2024-07-20 LAB — LIPID PANEL
Chol/HDL Ratio: 5.7 ratio — ABNORMAL HIGH (ref 0.0–5.0)
Cholesterol, Total: 239 mg/dL — ABNORMAL HIGH (ref 100–199)
HDL: 42 mg/dL (ref >39)
LDL Chol Calc (NIH): 180 mg/dL — ABNORMAL HIGH (ref 0–99)
Triglycerides: 96 mg/dL (ref 0–149)
VLDL Cholesterol Cal: 17 mg/dL (ref 5–40)

## 2024-07-20 LAB — MICROALBUMIN / CREATININE URINE RATIO
Creatinine, Urine: 357.5 mg/dL
Microalb/Creat Ratio: 17 mg/g{creat} (ref 0–29)
Microalbumin, Urine: 60.3 ug/mL

## 2024-07-20 LAB — HEMOGLOBIN A1C
Est. average glucose Bld gHb Est-mCnc: 206 mg/dL
Hgb A1c MFr Bld: 8.8 % — ABNORMAL HIGH (ref 4.8–5.6)

## 2024-07-22 ENCOUNTER — Ambulatory Visit: Payer: Self-pay | Admitting: Family Medicine

## 2024-07-22 DIAGNOSIS — E1122 Type 2 diabetes mellitus with diabetic chronic kidney disease: Secondary | ICD-10-CM

## 2024-07-22 DIAGNOSIS — E1169 Type 2 diabetes mellitus with other specified complication: Secondary | ICD-10-CM

## 2024-07-22 MED ORDER — DAPAGLIFLOZIN PROPANEDIOL 10 MG PO TABS: 10.0000 mg | ORAL_TABLET | Freq: Every day | ORAL | 1 refills | Status: AC

## 2024-07-22 MED ORDER — ROSUVASTATIN CALCIUM 5 MG PO TABS: 5.0000 mg | ORAL_TABLET | Freq: Every day | ORAL | 1 refills | Status: AC

## 2024-07-24 ENCOUNTER — Other Ambulatory Visit: Payer: Self-pay | Admitting: Family Medicine

## 2024-07-24 DIAGNOSIS — E1122 Type 2 diabetes mellitus with diabetic chronic kidney disease: Secondary | ICD-10-CM

## 2024-07-24 MED ORDER — FREESTYLE LIBRE 3 PLUS SENSOR MISC
3 refills | Status: AC
Start: 2024-07-24 — End: ?

## 2024-07-24 NOTE — Telephone Encounter (Signed)
 Called patient to let him know that the provider had spoke with our pharmacist and she gave her a discounted prescription card for the [facility].  The copay with the card is for $75/30 day, patient stated he was interested and will pick it up from the front desk.  Placed in pickup bin for patients picking up paperwork.

## 2024-07-26 NOTE — Telephone Encounter (Signed)
Printed and will give to provider

## 2024-08-08 ENCOUNTER — Ambulatory Visit: Admitting: Family Medicine

## 2024-08-08 ENCOUNTER — Encounter: Payer: Self-pay | Admitting: Family Medicine

## 2024-08-08 VITALS — BP 146/90 | HR 78 | Temp 98.1°F | Ht 73.0 in | Wt 202.0 lb

## 2024-08-08 DIAGNOSIS — I10 Essential (primary) hypertension: Secondary | ICD-10-CM | POA: Diagnosis not present

## 2024-08-08 DIAGNOSIS — N182 Chronic kidney disease, stage 2 (mild): Secondary | ICD-10-CM

## 2024-08-08 DIAGNOSIS — Z7984 Long term (current) use of oral hypoglycemic drugs: Secondary | ICD-10-CM | POA: Diagnosis not present

## 2024-08-08 DIAGNOSIS — E1122 Type 2 diabetes mellitus with diabetic chronic kidney disease: Secondary | ICD-10-CM | POA: Diagnosis not present

## 2024-08-08 MED ORDER — TIRZEPATIDE 2.5 MG/0.5ML ~~LOC~~ SOAJ: 2.5000 mg | SUBCUTANEOUS | 2 refills | Status: DC

## 2024-08-08 MED ORDER — LOSARTAN POTASSIUM-HCTZ 50-12.5 MG PO TABS: 1.0000 | ORAL_TABLET | Freq: Every day | ORAL | 0 refills | Status: DC

## 2024-08-08 NOTE — Patient Instructions (Signed)

## 2024-08-08 NOTE — Progress Notes (Signed)
 "     Established patient visit   Patient: Jeffrey Peck   DOB: 03-Jan-1991   33 y.o. Male  MRN: [id number] Visit Date: 08/08/2024  Today's healthcare provider: LAURAINE LOISE BUOY, DO   Chief Complaint  Patient presents with   Hypertension    Patient was last seen on 07/19/24.  At that time his medications were restarted and he presents today for follow up. Patient has been checking his blood pressure and they are still running high, mostly 180's over 110's.   Diabetes    Patient has been eating healthier-lean proteins and vegetables.  However this week he admits to eating out more and not as health.  He reports his readings at home of 200-325. A1C in October was 8.8   Subjective    HPI Jeffrey Peck is a 33 year old male with hypertension and diabetes who presents for management of elevated blood pressure and blood sugar levels.  His blood pressure has been consistently high, with recent readings improving to 146/90 mmHg from previous levels of 180s/110s. He measures his blood pressure twice daily, at approximately 5:30 AM and 11:30 AM. He restarted losartan  and hydrochlorothiazide  simultaneously, experiencing increased urination with the current dose of hydrochlorothiazide .  His blood sugar levels have been elevated, ranging from 150 to 325 mg/dL, with an average around 200 mg/dL. He attributes some fluctuations to dietary choices, such as eating hot dogs at a basketball game. He uses a continuous glucose meter to monitor his levels. He has not taken metformin before and prefers to avoid it due to hearing negative stories. He has started taking rosuvastatin  and Farxiga  for his diabetes management.  He denies personal history of pancreatitis, and denies personal or family history of medullary thyroid carcinoma or multiple endocrine neoplasia type 2 (MEN2).  He has discussed with his supervisor the possibility of working half a day remotely, which would allow him to incorporate gym visits  into his routine. He is not currently physically active at work.       Medications: Outpatient Medications Prior to Visit  Medication Sig   Amphet-Dextroamphet 3-Bead ER 50 MG CP24 Take 1 capsule by mouth daily.   Continuous Glucose Sensor (FREESTYLE LIBRE 3 PLUS SENSOR) MISC Change sensor every 15 days.   dapagliflozin  propanediol (FARXIGA ) 10 MG TABS tablet Take 1 tablet (10 mg total) by mouth daily.   rosuvastatin  (CRESTOR ) 5 MG tablet Take 1 tablet (5 mg total) by mouth daily.   [DISCONTINUED] hydrochlorothiazide  (HYDRODIURIL ) 12.5 MG tablet Take 1 tablet (12.5 mg total) by mouth daily.   [DISCONTINUED] losartan  (COZAAR ) 25 MG tablet Take 1 tablet (25 mg total) by mouth daily.   No facility-administered medications prior to visit.        Objective    BP (!) 146/90 (BP Location: Right Arm, Patient Position: Sitting, Cuff Size: Normal)   Pulse 78   Temp 98.1 F (36.7 C) (Oral)   Ht 6' 1 (1.854 m)   Wt 202 lb (91.6 kg)   SpO2 97%   BMI 26.65 kg/m          Physical Exam Vitals and nursing note reviewed.  Constitutional:      General: He is not in acute distress.    Appearance: Normal appearance.  HENT:     Head: Normocephalic and atraumatic.  Eyes:     General: No scleral icterus.    Conjunctiva/sclera: Conjunctivae normal.  Cardiovascular:     Rate and Rhythm: Normal rate.  Pulmonary:  Effort: Pulmonary effort is normal.  Neurological:     Mental Status: He is alert and oriented to person, place, and time. Mental status is at baseline.  Psychiatric:        Mood and Affect: Mood normal.        Behavior: Behavior normal.      No results found for any visits on 08/08/24.  Assessment & Plan    Primary hypertension -     Losartan  Potassium-HCTZ; Take 1 tablet by mouth daily.  Dispense: 90 tablet; Refill: 0  Type 2 diabetes mellitus with stage 2 chronic kidney disease, without long-term current use of insulin (HCC) -     Tirzepatide ; Inject 2.5 mg  into the skin once a week.  Dispense: 2 mL; Refill: 2     Primary hypertension Blood pressure improved to 146/90 mmHg. Discussed combining losartan  and hydrochlorothiazide  for convenience. - Combined losartan  and hydrochlorothiazide  into a single pill, with losartan  increased from 25 mg to 50 mg daily and hydrochlorothiazide  continued at 12.5 mg daily. - Instructed to set aside current tablets for future use, if needed, should low blood pressures occur. - Scheduled follow-up for February 2nd to reassess blood pressure and recheck A1c.  Type 2 diabetes mellitus with stage 2 chronic kidney disease, without long-term current use of insulin Blood glucose averages 200 mg/dL. Discussed GLP-1 receptor agonist Mounjaro  for glycemic control and renal benefits. Explained long-term use and potential side effects. - Prescribed Mounjaro . - Instructed to monitor for side effects. - Encouraged dietary modifications and physical activity. - Provided note for remote work to facilitate gym attendance.    Return in about 2 months (around 10/21/2024) for DM, HTN, Anx/Dep.      I discussed the assessment and treatment plan with the patient  The patient was provided an opportunity to ask questions and all were answered. The patient agreed with the plan and demonstrated an understanding of the instructions.   The patient was advised to call back or seek an in-person evaluation if the symptoms worsen or if the condition fails to improve as anticipated.    LAURAINE LOISE BUOY, DO  [facility] [phone] (phone) [phone] (fax)  [facility] "

## 2024-08-13 ENCOUNTER — Ambulatory Visit: Admitting: Family Medicine

## 2024-10-08 ENCOUNTER — Ambulatory Visit: Admitting: Family Medicine

## 2024-10-08 DIAGNOSIS — I10 Essential (primary) hypertension: Secondary | ICD-10-CM

## 2024-10-08 DIAGNOSIS — E1122 Type 2 diabetes mellitus with diabetic chronic kidney disease: Secondary | ICD-10-CM

## 2024-10-08 DIAGNOSIS — F419 Anxiety disorder, unspecified: Secondary | ICD-10-CM

## 2024-10-17 ENCOUNTER — Other Ambulatory Visit: Payer: Self-pay | Admitting: Family Medicine

## 2024-10-17 DIAGNOSIS — I10 Essential (primary) hypertension: Secondary | ICD-10-CM

## 2024-10-23 ENCOUNTER — Encounter: Payer: Self-pay | Admitting: Family Medicine

## 2024-10-23 ENCOUNTER — Ambulatory Visit: Admitting: Family Medicine

## 2024-10-23 VITALS — BP 142/104 | HR 79 | Temp 98.3°F | Ht 73.0 in | Wt 190.9 lb

## 2024-10-23 DIAGNOSIS — I10 Essential (primary) hypertension: Secondary | ICD-10-CM

## 2024-10-23 DIAGNOSIS — K760 Fatty (change of) liver, not elsewhere classified: Secondary | ICD-10-CM

## 2024-10-23 DIAGNOSIS — F418 Other specified anxiety disorders: Secondary | ICD-10-CM

## 2024-10-23 DIAGNOSIS — E1122 Type 2 diabetes mellitus with diabetic chronic kidney disease: Secondary | ICD-10-CM

## 2024-10-23 DIAGNOSIS — F4389 Other reactions to severe stress: Secondary | ICD-10-CM

## 2024-10-23 LAB — POCT GLYCOSYLATED HEMOGLOBIN (HGB A1C): Hemoglobin A1C: 5 % (ref 4.0–5.6)

## 2024-10-23 MED ORDER — ESCITALOPRAM OXALATE 10 MG PO TABS: 10.0000 mg | ORAL_TABLET | Freq: Every day | ORAL | 0 refills | Status: AC

## 2024-10-23 MED ORDER — TIRZEPATIDE 2.5 MG/0.5ML ~~LOC~~ SOAJ: 2.5000 mg | SUBCUTANEOUS | 3 refills | Status: AC

## 2024-10-23 MED ORDER — LOSARTAN POTASSIUM-HCTZ 50-12.5 MG PO TABS: 1.0000 | ORAL_TABLET | Freq: Every day | ORAL | 1 refills | Status: AC

## 2024-10-23 NOTE — Patient Instructions (Signed)
 I recommend checking [email or web address] for validated blood pressure  Check your blood pressure once daily, and any time you have concerning symptoms like headache, chest pain, dizziness, shortness of breath, or vision changes.   Our goal is less than 130/80.  To appropriately check your blood pressure, make sure you do the following:  1) Avoid caffeine, exercise, or tobacco products for 30 minutes before checking. Empty your bladder. 2) Sit with your back supported in a flat-backed chair. Rest your arm on something flat (arm of the chair, table, etc). 3) Sit still with your feet flat on the floor, resting, for at least 5 minutes.  4) Check your blood pressure. Take 1-2 readings.  5) Write down these readings and bring with you to any provider appointments.  Bring your home blood pressure machine with you to a provider's office for accuracy comparison at least once a year.   Make sure you take your blood pressure medications before you come to any office visit, even if you were asked to fast for labs.

## 2024-10-23 NOTE — Progress Notes (Unsigned)
 "     Established patient visit   Patient: Jeffrey Peck   DOB: 16-Oct-1990   34 y.o. Male  MRN: [id number] Visit Date: 10/23/2024  Today's healthcare provider: LAURAINE LOISE BUOY, DO   Chief Complaint  Patient presents with   Medical Management of Chronic Issues    Diabetic Eye Exam - [facility] [place], [place]  States he needs help with his anxiety and depression, wants to discuss getting back on the medication that was last prescribed.   Hypertension    Patient reports that he is not really monitoring his blood pressure at home, only when he feels bad.  The highest has been 130/116.  Patient is exercising and following a low salt diet.   Diabetes    Patient reports that he did monitor his blood sugar at home but he ran out of the [place].   Subjective    HPI Jeffrey Peck is a 34 year old male who presents for medication management and follow-up.  He has not been monitoring his blood pressure at home due to not having a calibrated cuff. He does not feel anxious. He has been off his blood pressure medication, losartan  with hydrochlorothiazide , since last week due to running out and not receiving a refill. He was previously on a combination pill of losartan  and hydrochlorothiazide , which he last received in November for a 90-day supply.  Regarding anxiety management, he previously took escitalopram  (Lexapro ) but stopped it due to adverse effects. He has been using hydroxyzine as needed, which he takes at night. He is still taking Adderall.  His A1c is currently at 5, and he is taking Mounjaro  once a week, which he administers on Sundays. He mentions a recent increase in the cost of Mounjaro  from $30 to $75. He has not used his [place] sensor since January and reports no issues with hypoglycemia.  He recently started rosuvastatin  and is also taking dapagliflozin  and Adderall. He picked up three medications from the pharmacy recently but was unsure if rosuvastatin  was among  them.   ***  {History (Optional):23778}  Medications: Show/hide medication list[1]  Review of Systems ***  {Insert previous labs (optional):23779} {See past labs  Heme  Chem  Endocrine  Serology  Results Review (optional):1}   Objective    BP (!) 142/104 (BP Location: Right Arm, Cuff Size: Large)   Pulse 79   Temp 98.3 F (36.8 C) (Oral)   Ht 6' 1 (1.854 m)   Wt 190 lb 14.4 oz (86.6 kg)   SpO2 99%   BMI 25.19 kg/m  {Insert last BP/Wt (optional):23777}{See vitals history (optional):1}   Physical Exam   Results for orders placed or performed in visit on 10/23/24  POCT HgB A1C  Result Value Ref Range   Hemoglobin A1C 5.0 4.0 - 5.6 %   HbA1c POC (<> result, manual entry)     HbA1c, POC (prediabetic range)     HbA1c, POC (controlled diabetic range)      Assessment & Plan    Primary hypertension  Hepatic steatosis  Type 2 diabetes mellitus with stage 2 chronic kidney disease, without long-term current use of insulin (HCC) -     POCT glycosylated hemoglobin (Hb A1C)  Chronic kidney disease, stage 2, mildly decreased GFR  Syphilis in male  Reaction to chronic stress  Mixed anxiety and depressive disorder     Type 2 diabetes mellitus with stage 2 chronic kidney disease A1c controlled at 5.0. Mounjaro  effective. No hypoglycemia.  Libre discontinued unless hypoglycemia symptoms occur. - Continue Mounjaro  2.5 mg weekly. - Discontinued Libre unless hypoglycemia symptoms occur.  Primary hypertension Blood pressure management suboptimal due to medication refill issues. Prefers losartan  and hydrochlorothiazide  despite cost. - Refilled losartan  and hydrochlorothiazide  with extra refill. - Monitor blood pressure at home.  Mixed anxiety and depressive disorder Escitalopram  previously discontinued due to adverse effects. Hydroxyzine used as needed. Discussed retrying escitalopram  with dose adjustment based on response. - Prescribed escitalopram  10 mg daily. -  Instructed to monitor response and communicate if dose adjustment is needed. - Scheduled follow-up in 4-6 weeks to assess medication efficacy and side effects. ***  No follow-ups on file.      I discussed the assessment and treatment plan with the patient  The patient was provided an opportunity to ask questions and all were answered. The patient agreed with the plan and demonstrated an understanding of the instructions.   The patient was advised to call back or seek an in-person evaluation if the symptoms worsen or if the condition fails to improve as anticipated.    LAURAINE LOISE BUOY, DO  [facility] [phone] (phone) [phone] (fax)  [facility]     [1]  Outpatient Medications Prior to Visit  Medication Sig   Amphet-Dextroamphet 3-Bead ER 50 MG CP24 Take 1 capsule by mouth daily.   Continuous Glucose Sensor (FREESTYLE LIBRE 3 PLUS SENSOR) MISC Change sensor every 15 days.   dapagliflozin  propanediol (FARXIGA ) 10 MG TABS tablet Take 1 tablet (10 mg total) by mouth daily.   losartan -hydrochlorothiazide  (HYZAAR) 50-12.5 MG tablet Take 1 tablet by mouth daily.   rosuvastatin  (CRESTOR ) 5 MG tablet Take 1 tablet (5 mg total) by mouth daily.   tirzepatide  (MOUNJARO ) 2.5 MG/0.5ML Pen Inject 2.5 mg into the skin once a week.   No facility-administered medications prior to visit.   "

## 2024-11-21 ENCOUNTER — Ambulatory Visit: Admitting: Family Medicine

## 2025-02-24 ENCOUNTER — Ambulatory Visit: Admitting: Family Medicine
